# Patient Record
Sex: Female | Born: 1970 | Race: Asian | Hispanic: No | Marital: Married | Smoking: Never smoker
Health system: Southern US, Community
[De-identification: ages and names within clinical notes are randomized; demographics above are authoritative.]

## PROBLEM LIST (undated history)

## (undated) DIAGNOSIS — E119 Type 2 diabetes mellitus without complications: Secondary | ICD-10-CM

## (undated) DIAGNOSIS — K219 Gastro-esophageal reflux disease without esophagitis: Secondary | ICD-10-CM

## (undated) DIAGNOSIS — E785 Hyperlipidemia, unspecified: Secondary | ICD-10-CM

## (undated) DIAGNOSIS — I10 Essential (primary) hypertension: Secondary | ICD-10-CM

## (undated) DIAGNOSIS — D259 Leiomyoma of uterus, unspecified: Secondary | ICD-10-CM

## (undated) HISTORY — DX: Hyperlipidemia, unspecified: E78.5

## (undated) HISTORY — DX: Essential (primary) hypertension: I10

---

## 2011-10-29 ENCOUNTER — Other Ambulatory Visit (INDEPENDENT_AMBULATORY_CARE_PROVIDER_SITE_OTHER): Payer: Managed Care, Other (non HMO)

## 2011-10-29 ENCOUNTER — Encounter: Payer: Self-pay | Admitting: Internal Medicine

## 2011-10-29 ENCOUNTER — Other Ambulatory Visit (HOSPITAL_COMMUNITY)
Admission: RE | Admit: 2011-10-29 | Discharge: 2011-10-29 | Disposition: A | Discharge status: Home / Self Care | Payer: Managed Care, Other (non HMO) | Source: Ambulatory Visit | Attending: Internal Medicine | Admitting: Internal Medicine

## 2011-10-29 ENCOUNTER — Ambulatory Visit (INDEPENDENT_AMBULATORY_CARE_PROVIDER_SITE_OTHER): Payer: Managed Care, Other (non HMO) | Admitting: Internal Medicine

## 2011-10-29 VITALS — BP 132/88 | HR 94 | Temp 98.0°F | Resp 16 | Wt 174.0 lb

## 2011-10-29 DIAGNOSIS — Z01419 Encounter for gynecological examination (general) (routine) without abnormal findings: Secondary | ICD-10-CM | POA: Insufficient documentation

## 2011-10-29 DIAGNOSIS — Z0000 Encounter for general adult medical examination without abnormal findings: Secondary | ICD-10-CM

## 2011-10-29 DIAGNOSIS — H524 Presbyopia: Secondary | ICD-10-CM | POA: Insufficient documentation

## 2011-10-29 DIAGNOSIS — I10 Essential (primary) hypertension: Secondary | ICD-10-CM | POA: Insufficient documentation

## 2011-10-29 DIAGNOSIS — N946 Dysmenorrhea, unspecified: Secondary | ICD-10-CM

## 2011-10-29 DIAGNOSIS — F4320 Adjustment disorder, unspecified: Secondary | ICD-10-CM

## 2011-10-29 DIAGNOSIS — Z124 Encounter for screening for malignant neoplasm of cervix: Secondary | ICD-10-CM

## 2011-10-29 DIAGNOSIS — Z136 Encounter for screening for cardiovascular disorders: Secondary | ICD-10-CM

## 2011-10-29 DIAGNOSIS — Z1231 Encounter for screening mammogram for malignant neoplasm of breast: Secondary | ICD-10-CM | POA: Insufficient documentation

## 2011-10-29 LAB — COMPREHENSIVE METABOLIC PANEL
ALT: 24 U/L (ref 0–35)
CO2: 28 mEq/L (ref 19–32)
Calcium: 9.2 mg/dL (ref 8.4–10.5)
Chloride: 103 mEq/L (ref 96–112)
GFR: 96.49 mL/min (ref >60.00)
Sodium: 139 mEq/L (ref 135–145)
Total Protein: 7.8 g/dL (ref 6.0–8.3)

## 2011-10-29 LAB — CBC WITH DIFFERENTIAL/PLATELET
Basophils Absolute: 0 10*3/uL (ref 0.0–0.1)
Lymphocytes Relative: 22.3 % (ref 12.0–46.0)
Monocytes Relative: 5.3 % (ref 3.0–12.0)
Platelets: 319 10*3/uL (ref 150.0–400.0)
RDW: 12.7 % (ref 11.5–14.6)

## 2011-10-29 LAB — LDL CHOLESTEROL, DIRECT: Direct LDL: 163.3 mg/dL

## 2011-10-29 LAB — LIPID PANEL
Cholesterol: 235 mg/dL — ABNORMAL HIGH (ref 0–200)
Total CHOL/HDL Ratio: 5
Triglycerides: 166 mg/dL — ABNORMAL HIGH (ref 0.0–149.0)
VLDL: 33.2 mg/dL (ref 0.0–40.0)

## 2011-10-29 LAB — URINALYSIS, ROUTINE W REFLEX MICROSCOPIC
Ketones, ur: NEGATIVE
Specific Gravity, Urine: 1.01 (ref 1.000–1.030)
Urine Glucose: NEGATIVE
Urobilinogen, UA: 0.2 (ref 0.0–1.0)

## 2011-10-29 NOTE — Assessment & Plan Note (Signed)
Referral for psychotherapy.

## 2011-10-29 NOTE — Progress Notes (Signed)
Subjective:    Patient ID: Kathryn Sexton, female    DOB: 17-Aug-1971, 41 y.o.   MRN: [id number]  Hypertension This is a chronic problem. The current episode started more than 1 year ago. The problem has been gradually improving since onset. The problem is controlled. Associated symptoms include anxiety. Pertinent negatives include no blurred vision, chest pain, headaches, malaise/fatigue, neck pain, orthopnea, palpitations, peripheral edema, PND, shortness of breath or sweats. There are no associated agents to hypertension. Past treatments include nothing. The current treatment provides significant improvement. There are no compliance problems.       Review of Systems  Constitutional: Negative for fever, chills, malaise/fatigue, diaphoresis, activity change, appetite change, fatigue and unexpected weight change.  HENT: Negative.  Negative for neck pain.   Eyes: Positive for visual disturbance (difficulty with reading). Negative for blurred vision, photophobia, pain, discharge, redness and itching.  Respiratory: Negative for apnea, cough, choking, chest tightness, shortness of breath, wheezing and stridor.   Cardiovascular: Negative for chest pain, palpitations, orthopnea, leg swelling and PND.  Gastrointestinal: Negative for nausea, vomiting, abdominal pain, diarrhea, constipation, blood in stool, abdominal distention, anal bleeding and rectal pain.  Genitourinary: Positive for menstrual problem (she has monthly menstrual pain). Negative for dysuria, urgency, frequency, hematuria, flank pain, decreased urine volume, vaginal bleeding, vaginal discharge, enuresis, difficulty urinating, genital sores, vaginal pain, pelvic pain and dyspareunia.  Musculoskeletal: Negative for myalgias, back pain, joint swelling, arthralgias and gait problem.  Skin: Negative for color change, pallor, rash and wound.  Neurological: Negative for dizziness, tremors, seizures, syncope, facial asymmetry, speech difficulty,  weakness, light-headedness, numbness and headaches.  Hematological: Negative for adenopathy. Does not bruise/bleed easily.  Psychiatric/Behavioral: Positive for dysphoric mood and agitation (irritable, she and her husband want to see a psychologist). Negative for suicidal ideas, hallucinations, behavioral problems, confusion, sleep disturbance and self-injury. The patient is not nervous/anxious and is not hyperactive.        Objective:   Physical Exam  Vitals reviewed. Constitutional: She is oriented to person, place, and time. She appears well-developed and well-nourished. No distress.  HENT:  Head: Normocephalic and atraumatic.  Mouth/Throat: Oropharynx is clear and moist. No oropharyngeal exudate.  Eyes: Conjunctivae are normal. Right eye exhibits no discharge. Left eye exhibits no discharge. No scleral icterus.  Neck: Normal range of motion. Neck supple. No JVD present. No tracheal deviation present. No thyromegaly present.  Cardiovascular: Normal rate, regular rhythm, normal heart sounds and intact distal pulses.  Exam reveals no gallop and no friction rub.   No murmur heard. Pulmonary/Chest: Effort normal and breath sounds normal. No stridor. No respiratory distress. She has no wheezes. She has no rales. She exhibits no tenderness.  Abdominal: Soft. Bowel sounds are normal. She exhibits no distension and no mass. There is no tenderness. There is no rebound and no guarding. Hernia confirmed negative in the right inguinal area and confirmed negative in the left inguinal area.  Genitourinary: Rectum normal and uterus normal. Rectal exam shows no external hemorrhoid, no internal hemorrhoid, no fissure, no mass, no tenderness and anal tone normal. Guaiac negative stool. No breast swelling, tenderness, discharge or bleeding. Pelvic exam was performed with patient supine. No labial fusion. There is no rash, tenderness, lesion or injury on the right labia. There is no rash, tenderness, lesion or  injury on the left labia. Uterus is not deviated, not enlarged, not fixed and not tender. Cervix exhibits no motion tenderness, no discharge and no friability. Right adnexum displays no mass, no tenderness and  no fullness. Left adnexum displays no mass, no tenderness and no fullness. No erythema, tenderness or bleeding around the vagina. No foreign body around the vagina. No signs of injury around the vagina. No vaginal discharge found.  Musculoskeletal: Normal range of motion. She exhibits no edema and no tenderness.  Lymphadenopathy:    She has no cervical adenopathy.       Right: No inguinal adenopathy present.       Left: No inguinal adenopathy present.  Neurological: She is oriented to person, place, and time.  Skin: Skin is warm and dry. No rash noted. She is not diaphoretic. No erythema. No pallor.  Psychiatric: She has a normal mood and affect. Her behavior is normal. Judgment and thought content normal.          Assessment & Plan:

## 2011-10-29 NOTE — Assessment & Plan Note (Signed)
Her BP is well controlled with lifestyle modifications, I will check her labs today to look for secondary causes and end organ damage

## 2011-10-29 NOTE — Assessment & Plan Note (Signed)
ophth referral 

## 2011-10-29 NOTE — Patient Instructions (Signed)

## 2011-10-29 NOTE — Assessment & Plan Note (Signed)
ordered

## 2011-10-29 NOTE — Assessment & Plan Note (Signed)
She will take otc nsaids

## 2011-10-29 NOTE — Assessment & Plan Note (Signed)
Exam done, labs and mammo ordered, PAP sent, pt ed material was given

## 2011-11-02 ENCOUNTER — Encounter: Payer: Self-pay | Admitting: Internal Medicine

## 2011-11-02 ENCOUNTER — Ambulatory Visit
Admission: RE | Admit: 2011-11-02 | Discharge: 2011-11-02 | Disposition: A | Discharge status: Home / Self Care | Payer: Managed Care, Other (non HMO) | Source: Ambulatory Visit | Attending: Internal Medicine | Admitting: Internal Medicine

## 2011-11-02 DIAGNOSIS — Z1231 Encounter for screening mammogram for malignant neoplasm of breast: Secondary | ICD-10-CM

## 2011-11-30 ENCOUNTER — Ambulatory Visit (INDEPENDENT_AMBULATORY_CARE_PROVIDER_SITE_OTHER): Payer: 59 | Admitting: Psychology

## 2011-11-30 DIAGNOSIS — F411 Generalized anxiety disorder: Secondary | ICD-10-CM

## 2011-11-30 DIAGNOSIS — F331 Major depressive disorder, recurrent, moderate: Secondary | ICD-10-CM

## 2012-09-29 ENCOUNTER — Other Ambulatory Visit (HOSPITAL_COMMUNITY)
Admission: RE | Admit: 2012-09-29 | Discharge: 2012-09-29 | Disposition: A | Discharge status: Home / Self Care | Payer: BC Managed Care – PPO | Source: Ambulatory Visit | Attending: Family Medicine | Admitting: Family Medicine

## 2012-09-29 DIAGNOSIS — Z1151 Encounter for screening for human papillomavirus (HPV): Secondary | ICD-10-CM | POA: Insufficient documentation

## 2012-09-29 DIAGNOSIS — Z124 Encounter for screening for malignant neoplasm of cervix: Secondary | ICD-10-CM | POA: Insufficient documentation

## 2012-10-12 ENCOUNTER — Other Ambulatory Visit: Payer: Self-pay | Admitting: Family Medicine

## 2012-10-12 DIAGNOSIS — Z1231 Encounter for screening mammogram for malignant neoplasm of breast: Secondary | ICD-10-CM

## 2012-10-13 ENCOUNTER — Ambulatory Visit
Admission: RE | Admit: 2012-10-13 | Discharge: 2012-10-13 | Disposition: A | Discharge status: Home / Self Care | Payer: BC Managed Care – PPO | Source: Ambulatory Visit | Attending: Family Medicine | Admitting: Family Medicine

## 2012-10-13 DIAGNOSIS — Z1231 Encounter for screening mammogram for malignant neoplasm of breast: Secondary | ICD-10-CM

## 2012-11-25 ENCOUNTER — Other Ambulatory Visit: Payer: Self-pay | Admitting: Obstetrics & Gynecology

## 2012-11-25 DIAGNOSIS — D219 Benign neoplasm of connective and other soft tissue, unspecified: Secondary | ICD-10-CM

## 2012-12-01 ENCOUNTER — Other Ambulatory Visit: Payer: BC Managed Care – PPO

## 2012-12-02 ENCOUNTER — Other Ambulatory Visit: Payer: BC Managed Care – PPO

## 2012-12-09 ENCOUNTER — Ambulatory Visit
Admission: RE | Admit: 2012-12-09 | Discharge: 2012-12-09 | Disposition: A | Discharge status: Home / Self Care | Payer: BC Managed Care – PPO | Source: Ambulatory Visit | Attending: Obstetrics & Gynecology | Admitting: Obstetrics & Gynecology

## 2012-12-09 DIAGNOSIS — D219 Benign neoplasm of connective and other soft tissue, unspecified: Secondary | ICD-10-CM

## 2012-12-09 MED ORDER — GADOBENATE DIMEGLUMINE 529 MG/ML IV SOLN
15.0000 mL | Freq: Once | INTRAVENOUS | Status: AC | PRN
  Administered 2012-12-09: 15 mL via INTRAVENOUS

## 2012-12-31 ENCOUNTER — Encounter (HOSPITAL_COMMUNITY): Payer: Self-pay | Admitting: Pharmacy Technician

## 2013-01-02 ENCOUNTER — Encounter (HOSPITAL_COMMUNITY): Payer: Self-pay

## 2013-01-02 ENCOUNTER — Encounter (HOSPITAL_COMMUNITY)
Admission: RE | Admit: 2013-01-02 | Discharge: 2013-01-02 | Disposition: A | Discharge status: Home / Self Care | Payer: BC Managed Care – PPO | Source: Ambulatory Visit | Attending: Obstetrics & Gynecology | Admitting: Obstetrics & Gynecology

## 2013-01-02 LAB — CBC
HCT: 39.8 % (ref 36.0–46.0)
MCH: 27.8 pg (ref 26.0–34.0)
MCV: 83.3 fL (ref 78.0–100.0)
Platelets: 345 10*3/uL (ref 150–400)
RBC: 4.78 MIL/uL (ref 3.87–5.11)
RDW: 12.6 % (ref 11.5–15.5)
WBC: 10.1 10*3/uL (ref 4.0–10.5)

## 2013-01-02 NOTE — Pre-Procedure Instructions (Signed)
Pt's elevated BPs, history and EKG of 09/2011 and today (01/02/13) reviewed by Dr Arby Barrette. No orders given. He asked that I make patient aware that if pressures elevated on surgery day, case may be canceled. This was discussed with patient and her husband. Gave them the option of possibly seeing primary MD prior to surgery day.

## 2013-01-02 NOTE — Patient Instructions (Addendum)
20 [name]  01/02/2013   Your procedure is scheduled on:  01/09/13  Enter through the Main Entrance of [facility] at 1030 AM.  Pick up the phone at the desk and dial [phone].   Call this number if you have problems the morning of surgery: [phone]   Remember:   Do not eat food:After Midnight.  Do not drink clear liquids: 6 Hours before arrival.  Take these medicines the morning of surgery with A SIP OF WATER: NA   Do not wear jewelry, make-up or nail polish.  Do not wear lotions, powders, or perfumes. You may wear deodorant.  Do not shave 48 hours prior to surgery.  Do not bring valuables to the hospital.  Contacts, dentures or bridgework may not be worn into surgery.  Leave suitcase in the car. After surgery it may be brought to your room.  For patients admitted to the hospital, checkout time is 11:00 AM the day of discharge.   Patients discharged the day of surgery will not be allowed to drive home.  Name and phone number of your driver: NA  Special Instructions: Shower using CHG 2 nights before surgery and the night before surgery.  If you shower the day of surgery use CHG.  Use special wash - you have one bottle of CHG for all showers.  You should use approximately 1/3 of the bottle for each shower.   Please read over the following fact sheets that you were given: MRSA Information

## 2013-01-05 ENCOUNTER — Other Ambulatory Visit: Payer: Self-pay | Admitting: Obstetrics & Gynecology

## 2013-01-09 ENCOUNTER — Encounter (HOSPITAL_COMMUNITY): Payer: Self-pay | Admitting: Anesthesiology

## 2013-01-09 ENCOUNTER — Encounter (HOSPITAL_COMMUNITY)
Admission: RE | Disposition: A | Discharge status: Home / Self Care | Payer: Self-pay | Source: Ambulatory Visit | Attending: Obstetrics & Gynecology

## 2013-01-09 ENCOUNTER — Observation Stay (HOSPITAL_COMMUNITY)
Admission: RE | Admit: 2013-01-09 | Discharge: 2013-01-10 | Disposition: A | Discharge status: Home / Self Care | Payer: BC Managed Care – PPO | Source: Ambulatory Visit | Attending: Obstetrics & Gynecology | Admitting: Obstetrics & Gynecology

## 2013-01-09 ENCOUNTER — Ambulatory Visit (HOSPITAL_COMMUNITY): Payer: BC Managed Care – PPO | Admitting: Anesthesiology

## 2013-01-09 ENCOUNTER — Encounter (HOSPITAL_COMMUNITY): Payer: Self-pay | Admitting: *Deleted

## 2013-01-09 DIAGNOSIS — I10 Essential (primary) hypertension: Secondary | ICD-10-CM | POA: Insufficient documentation

## 2013-01-09 DIAGNOSIS — D252 Subserosal leiomyoma of uterus: Principal | ICD-10-CM | POA: Insufficient documentation

## 2013-01-09 DIAGNOSIS — D251 Intramural leiomyoma of uterus: Secondary | ICD-10-CM | POA: Insufficient documentation

## 2013-01-09 HISTORY — PX: ROBOT ASSISTED MYOMECTOMY: SHX5142

## 2013-01-09 LAB — TYPE AND SCREEN
ABO/RH(D): B POS
Antibody Screen: NEGATIVE

## 2013-01-09 LAB — PREGNANCY, URINE: Preg Test, Ur: NEGATIVE

## 2013-01-09 SURGERY — ROBOTIC ASSISTED MYOMECTOMY
Anesthesia: General | Site: Abdomen | Wound class: Clean Contaminated

## 2013-01-09 MED ORDER — ROCURONIUM BROMIDE 50 MG/5ML IV SOLN
INTRAVENOUS | Status: AC
  Filled 2013-01-09: qty 1

## 2013-01-09 MED ORDER — DEXAMETHASONE SODIUM PHOSPHATE 10 MG/ML IJ SOLN
INTRAMUSCULAR | Status: AC
  Filled 2013-01-09: qty 1

## 2013-01-09 MED ORDER — OXYCODONE-ACETAMINOPHEN 5-325 MG PO TABS: 1.0000 | ORAL_TABLET | ORAL | Status: DC | PRN

## 2013-01-09 MED ORDER — KETOROLAC TROMETHAMINE 30 MG/ML IJ SOLN
INTRAMUSCULAR | Status: DC | PRN
  Administered 2013-01-09: 30 mg via INTRAVENOUS

## 2013-01-09 MED ORDER — PROPOFOL 10 MG/ML IV BOLUS
INTRAVENOUS | Status: DC | PRN
  Administered 2013-01-09: 150 mg via INTRAVENOUS

## 2013-01-09 MED ORDER — LACTATED RINGERS IR SOLN
Status: DC | PRN
  Administered 2013-01-09: 3000 mL

## 2013-01-09 MED ORDER — LACTATED RINGERS IV SOLN
INTRAVENOUS | Status: DC
  Administered 2013-01-09: 1000 mL via INTRAVENOUS
  Administered 2013-01-10: 04:00:00 via INTRAVENOUS

## 2013-01-09 MED ORDER — VASOPRESSIN 20 UNIT/ML IJ SOLN
INTRAMUSCULAR | Status: AC
  Filled 2013-01-09: qty 2

## 2013-01-09 MED ORDER — BUPIVACAINE HCL (PF) 0.25 % IJ SOLN
INTRAMUSCULAR | Status: DC | PRN
  Administered 2013-01-09: 16 mL

## 2013-01-09 MED ORDER — DEXTROSE 5 % IV SOLN
2.0000 g | Freq: Once | INTRAVENOUS | Status: AC
  Administered 2013-01-09: 2 g via INTRAVENOUS
  Filled 2013-01-09: qty 2

## 2013-01-09 MED ORDER — PROPOFOL 10 MG/ML IV EMUL
INTRAVENOUS | Status: AC
  Filled 2013-01-09: qty 40

## 2013-01-09 MED ORDER — DEXAMETHASONE SODIUM PHOSPHATE 10 MG/ML IJ SOLN
INTRAMUSCULAR | Status: DC | PRN
  Administered 2013-01-09: 10 mg via INTRAVENOUS

## 2013-01-09 MED ORDER — KETOROLAC TROMETHAMINE 30 MG/ML IJ SOLN
INTRAMUSCULAR | Status: AC
  Filled 2013-01-09: qty 1

## 2013-01-09 MED ORDER — MIDAZOLAM HCL 2 MG/2ML IJ SOLN
INTRAMUSCULAR | Status: AC
  Filled 2013-01-09: qty 2

## 2013-01-09 MED ORDER — METOCLOPRAMIDE HCL 5 MG/ML IJ SOLN: 10.0000 mg | Freq: Once | INTRAMUSCULAR | Status: DC | PRN

## 2013-01-09 MED ORDER — BUPIVACAINE HCL (PF) 0.25 % IJ SOLN
INTRAMUSCULAR | Status: AC
  Filled 2013-01-09: qty 30

## 2013-01-09 MED ORDER — LACTATED RINGERS IV SOLN
INTRAVENOUS | Status: DC
  Administered 2013-01-09 (×3): via INTRAVENOUS

## 2013-01-09 MED ORDER — IBUPROFEN 600 MG PO TABS: 600.0000 mg | ORAL_TABLET | Freq: Four times a day (QID) | ORAL | Status: DC | PRN

## 2013-01-09 MED ORDER — LABETALOL HCL 5 MG/ML IV SOLN
INTRAVENOUS | Status: AC
  Filled 2013-01-09: qty 4

## 2013-01-09 MED ORDER — FENTANYL CITRATE 0.05 MG/ML IJ SOLN
INTRAMUSCULAR | Status: AC
  Filled 2013-01-09: qty 2

## 2013-01-09 MED ORDER — LABETALOL HCL 5 MG/ML IV SOLN
INTRAVENOUS | Status: DC | PRN
  Administered 2013-01-09: 5 mg via INTRAVENOUS
  Administered 2013-01-09: 10 mg via INTRAVENOUS
  Administered 2013-01-09: 5 mg via INTRAVENOUS

## 2013-01-09 MED ORDER — SCOPOLAMINE 1 MG/3DAYS TD PT72
MEDICATED_PATCH | TRANSDERMAL | Status: AC
  Administered 2013-01-09: 1.5 mg via TRANSDERMAL
  Filled 2013-01-09: qty 1

## 2013-01-09 MED ORDER — MEPERIDINE HCL 25 MG/ML IJ SOLN: 6.2500 mg | INTRAMUSCULAR | Status: DC | PRN

## 2013-01-09 MED ORDER — ONDANSETRON HCL 4 MG/2ML IJ SOLN
INTRAMUSCULAR | Status: AC
  Filled 2013-01-09: qty 2

## 2013-01-09 MED ORDER — NEOSTIGMINE METHYLSULFATE 1 MG/ML IJ SOLN
INTRAMUSCULAR | Status: AC
  Filled 2013-01-09: qty 1

## 2013-01-09 MED ORDER — MIDAZOLAM HCL 5 MG/5ML IJ SOLN
INTRAMUSCULAR | Status: DC | PRN
  Administered 2013-01-09: 2 mg via INTRAVENOUS

## 2013-01-09 MED ORDER — GLYCOPYRROLATE 0.2 MG/ML IJ SOLN
INTRAMUSCULAR | Status: AC
  Filled 2013-01-09: qty 2

## 2013-01-09 MED ORDER — SUFENTANIL CITRATE 50 MCG/ML IV SOLN
INTRAVENOUS | Status: AC
  Filled 2013-01-09: qty 1

## 2013-01-09 MED ORDER — ARTIFICIAL TEARS OP OINT
TOPICAL_OINTMENT | OPHTHALMIC | Status: DC | PRN
  Administered 2013-01-09: 1 via OPHTHALMIC

## 2013-01-09 MED ORDER — NEOSTIGMINE METHYLSULFATE 1 MG/ML IJ SOLN
INTRAMUSCULAR | Status: DC | PRN
  Administered 2013-01-09: 3 mg via INTRAVENOUS

## 2013-01-09 MED ORDER — ROCURONIUM BROMIDE 100 MG/10ML IV SOLN
INTRAVENOUS | Status: DC | PRN
  Administered 2013-01-09: 50 mg via INTRAVENOUS
  Administered 2013-01-09: 5 mg via INTRAVENOUS
  Administered 2013-01-09 (×3): 10 mg via INTRAVENOUS

## 2013-01-09 MED ORDER — VASOPRESSIN 20 UNIT/ML IJ SOLN
INTRAMUSCULAR | Status: DC | PRN
  Administered 2013-01-09: 40 [IU]

## 2013-01-09 MED ORDER — ONDANSETRON HCL 4 MG/2ML IJ SOLN
INTRAMUSCULAR | Status: DC | PRN
  Administered 2013-01-09: 4 mg via INTRAVENOUS

## 2013-01-09 MED ORDER — GLYCOPYRROLATE 0.2 MG/ML IJ SOLN
INTRAMUSCULAR | Status: AC
  Filled 2013-01-09: qty 1

## 2013-01-09 MED ORDER — LIDOCAINE HCL (CARDIAC) 20 MG/ML IV SOLN
INTRAVENOUS | Status: AC
  Filled 2013-01-09: qty 5

## 2013-01-09 MED ORDER — SUFENTANIL CITRATE 50 MCG/ML IV SOLN
INTRAVENOUS | Status: DC | PRN
  Administered 2013-01-09: 15 ug via INTRAVENOUS
  Administered 2013-01-09: 10 ug via INTRAVENOUS
  Administered 2013-01-09: 5 ug via INTRAVENOUS
  Administered 2013-01-09: 10 ug via INTRAVENOUS

## 2013-01-09 MED ORDER — HYDROMORPHONE HCL PF 1 MG/ML IJ SOLN
1.0000 mg | INTRAMUSCULAR | Status: DC | PRN
  Administered 2013-01-09: 1 mg via INTRAVENOUS
  Filled 2013-01-09: qty 1

## 2013-01-09 MED ORDER — LIDOCAINE HCL (CARDIAC) 20 MG/ML IV SOLN
INTRAVENOUS | Status: DC | PRN
  Administered 2013-01-09: 40 mg via INTRAVENOUS

## 2013-01-09 MED ORDER — SCOPOLAMINE 1 MG/3DAYS TD PT72: 1.0000 | MEDICATED_PATCH | TRANSDERMAL | Status: DC

## 2013-01-09 MED ORDER — FENTANYL CITRATE 0.05 MG/ML IJ SOLN
25.0000 ug | INTRAMUSCULAR | Status: DC | PRN
  Administered 2013-01-09 (×2): 50 ug via INTRAVENOUS

## 2013-01-09 MED ORDER — GLYCOPYRROLATE 0.2 MG/ML IJ SOLN
INTRAMUSCULAR | Status: DC | PRN
  Administered 2013-01-09: .6 mg via INTRAVENOUS

## 2013-01-09 SURGICAL SUPPLY — 57 items
BARRIER ADHS 3X4 INTERCEED (GAUZE/BANDAGES/DRESSINGS) ×4 IMPLANT
BLADE LAP MORCELLATOR 15X9.5 (ELECTROSURGICAL) ×2 IMPLANT
CHLORAPREP W/TINT 26ML (MISCELLANEOUS) ×2 IMPLANT
CLOTH BEACON ORANGE TIMEOUT ST (SAFETY) ×2 IMPLANT
DECANTER SPIKE VIAL GLASS SM (MISCELLANEOUS) ×2 IMPLANT
DERMABOND ADVANCED (GAUZE/BANDAGES/DRESSINGS) ×1
DERMABOND ADVANCED .7 DNX12 (GAUZE/BANDAGES/DRESSINGS) ×1 IMPLANT
DRAPE HUG U DISPOSABLE (DRAPE) ×2 IMPLANT
DRAPE LG THREE QUARTER DISP (DRAPES) ×4 IMPLANT
DRAPE WARM FLUID 44X44 (DRAPE) ×2 IMPLANT
ELECT REM PT RETURN 9FT ADLT (ELECTROSURGICAL) ×2
ELECTRODE REM PT RTRN 9FT ADLT (ELECTROSURGICAL) ×1 IMPLANT
EVACUATOR SMOKE 8.L (FILTER) ×2 IMPLANT
GAUZE VASELINE 3X9 (GAUZE/BANDAGES/DRESSINGS) ×2 IMPLANT
GLOVE BIO SURGEON STRL SZ 6.5 (GLOVE) ×2 IMPLANT
GLOVE BIOGEL PI IND STRL 7.0 (GLOVE) ×2 IMPLANT
GLOVE BIOGEL PI INDICATOR 7.0 (GLOVE) ×2
GLOVE ECLIPSE 6.5 STRL STRAW (GLOVE) ×6 IMPLANT
GOWN STRL REIN XL XLG (GOWN DISPOSABLE) ×12 IMPLANT
IV SET ADMIN PUMP GEMINI W/NLD (IV SETS) ×2 IMPLANT
IV STOPCOCK 4 WAY 40  W/Y SET (IV SOLUTION) ×1
IV STOPCOCK 4 WAY 40 W/Y SET (IV SOLUTION) ×1 IMPLANT
KIT ACCESSORY DA VINCI DISP (KITS) ×1
KIT ACCESSORY DVNC DISP (KITS) ×1 IMPLANT
LEGGING LITHOTOMY PAIR STRL (DRAPES) ×2 IMPLANT
NEEDLE HYPO 22GX1.5 SAFETY (NEEDLE) ×2 IMPLANT
OCCLUDER COLPOPNEUMO (BALLOONS) IMPLANT
PACK LAVH (CUSTOM PROCEDURE TRAY) ×2 IMPLANT
SET CYSTO W/LG BORE CLAMP LF (SET/KITS/TRAYS/PACK) IMPLANT
SET IRRIG TUBING LAPAROSCOPIC (IRRIGATION / IRRIGATOR) ×4 IMPLANT
SOLUTION ELECTROLUBE (MISCELLANEOUS) ×2 IMPLANT
SUT VIC AB 0 CT1 27 (SUTURE) ×2
SUT VIC AB 0 CT1 27XBRD ANTBC (SUTURE) ×2 IMPLANT
SUT VIC AB 2-0 CT1 27 (SUTURE) ×1
SUT VIC AB 2-0 CT1 TAPERPNT 27 (SUTURE) ×1 IMPLANT
SUT VIC AB 4-0 PS2 27 (SUTURE) IMPLANT
SUT VICRYL 0 UR6 27IN ABS (SUTURE) ×4 IMPLANT
SUT VLOC 180 0 9IN  GS21 (SUTURE) ×2
SUT VLOC 180 0 9IN GS21 (SUTURE) ×2 IMPLANT
SUT VLOC 180 2-0 9IN GS21 (SUTURE) ×2 IMPLANT
SYR 50ML LL SCALE MARK (SYRINGE) ×2 IMPLANT
SYSTEM CONVERTIBLE TROCAR (TROCAR) ×4 IMPLANT
TIP RUMI ORANGE 6.7MMX12CM (TIP) ×2 IMPLANT
TIP UTERINE 5.1X6CM LAV DISP (MISCELLANEOUS) IMPLANT
TIP UTERINE 6.7X10CM GRN DISP (MISCELLANEOUS) IMPLANT
TIP UTERINE 6.7X6CM WHT DISP (MISCELLANEOUS) IMPLANT
TIP UTERINE 6.7X8CM BLUE DISP (MISCELLANEOUS) IMPLANT
TOWEL OR 17X24 6PK STRL BLUE (TOWEL DISPOSABLE) ×4 IMPLANT
TRAY FOLEY BAG SILVER LF 14FR (CATHETERS) ×2 IMPLANT
TROCAR 12M 150ML BLUNT (TROCAR) IMPLANT
TROCAR DISP BLADELESS 8 DVNC (TROCAR) ×1 IMPLANT
TROCAR DISP BLADELESS 8MM (TROCAR) ×1
TROCAR OPTI TIP 12M 100M (ENDOMECHANICALS) IMPLANT
TROCAR XCEL 12X100 BLDLESS (ENDOMECHANICALS) ×2 IMPLANT
TROCAR XCEL NON-BLD 5MMX100MML (ENDOMECHANICALS) ×2 IMPLANT
TUBING FILTER THERMOFLATOR (ELECTROSURGICAL) ×2 IMPLANT
WATER STERILE IRR 1000ML POUR (IV SOLUTION) ×6 IMPLANT

## 2013-01-09 NOTE — H&P (Signed)
Kathryn Sexton is an 42 y.o. female  G1P1  RP:  Sxic uterine myomas for myomectomies assisted with Engineer, building services  Pertinent Gynecological History: Menses: flow is moderate Contraception: none Blood transfusions: none Sexually transmitted diseases: no past history Previous GYN Procedures: C/S  Last mammogram: normal Last pap: normal OB History:  G1P1 C/S  Menstrual History:  No LMP recorded.    Past Medical History  Diagnosis Date  . Hypertension     states BP changes, no medication    Past Surgical History  Procedure Laterality Date  . Cesarean section      Family History  Problem Relation Age of Onset  . Hypertension Father   . Stroke Neg Hx   . Cancer Neg Hx   . Diabetes Neg Hx   . Heart disease Neg Hx   . Hyperlipidemia Neg Hx     Social History:  reports that she has never smoked. She does not have any smokeless tobacco history on file. She reports that she does not drink alcohol or use illicit drugs.  Allergies: No Known Allergies  No prescriptions prior to admission     Blood pressure 154/106, pulse 95, temperature 98.1 F (36.7 C), temperature source Oral, SpO2 99.00%.  Pelvic US and MRI:  Largest 4 myomas IM 3 to 7 cm.  Benign appearance.   Results for orders placed during the hospital encounter of 01/09/13 (from the past 24 hour(s))  TYPE AND SCREEN     Status: None   Collection Time    01/09/13 11:29 AM      Result Value Range   ABO/RH(D) B POS     Antibody Screen NEG     Sample Expiration 01/12/2013      No results found.  Assessment/Plan:  Sxic uterine myomas for myomectomies assisted with Engineer, building services.  Surgery and risks reviewed.  Kathryn Sexton,MARIE-LYNE 01/09/2013, 12:47 PM

## 2013-01-09 NOTE — Anesthesia Postprocedure Evaluation (Signed)
  Anesthesia Post-op Note  Patient: Kathryn Sexton  Procedure(s) Performed: Procedure(s): ROBOTIC ASSISTED MYOMECTOMY (N/A)  Patient Location: PACU  Anesthesia Type:General  Level of Consciousness: awake, oriented and sedated  Airway and Oxygen Therapy: Patient Spontanous Breathing  Post-op Pain: none  Post-op Assessment: Post-op Vital signs reviewed, Patient's Cardiovascular Status Stable, Respiratory Function Stable, Patent Airway, No signs of Nausea or vomiting and Pain level controlled  Post-op Vital Signs: Reviewed and stable  Complications: No apparent anesthesia complications

## 2013-01-09 NOTE — Transfer of Care (Signed)
Immediate Anesthesia Transfer of Care Note  Patient: Kathryn Sexton  Procedure(s) Performed: Procedure(s): ROBOTIC ASSISTED MYOMECTOMY (N/A)  Patient Location: PACU  Anesthesia Type:General  Level of Consciousness: awake, alert , oriented and patient cooperative  Airway & Oxygen Therapy: Patient Spontanous Breathing and Patient connected to nasal cannula oxygen  Post-op Assessment: Report given to PACU RN and Post -op Vital signs reviewed and stable  Post vital signs: Reviewed and stable  Complications: No apparent anesthesia complications

## 2013-01-09 NOTE — Op Note (Signed)
01/09/2013  5:24 PM  PATIENT:  Kathryn Sexton  42 y.o. female  PRE-OPERATIVE DIAGNOSIS:  Large Symptomatic Fibroids    POST-OPERATIVE DIAGNOSIS:  large symptomatic fibroids  PROCEDURE:  Procedure(s): ROBOTIC ASSISTED MYOMECTOMIES  SURGEON:  Surgeon(s): Genia Del, MD Lenoard Aden, MD  ASSISTANTS: Dr Lenoard Aden   ANESTHESIA:   general  PROCEDURE:  Under general anesthesia with endotracheal intubation the patient is in lithotomy position. She is prepped with ChloraPrep on the abdomen and with Betadine on the suprapubic, vulvar and vaginal areas. She is draped as usual.  Vaginally we insert the weighted speculum.  The anterior lip of the cervix is grasped with a tenaculum. Dilation of the cervix with Hegar dilators. The hysterometry is at 16 cm. A #12 roomy is used with a small Coe ring and those are put in place easily.  The tenaculum and weighted speculum were removed. The Foley is inserted in the bladder. We go to the abdomen. A supraumbilical incision is done with the scalpel over 1.5 cm after infiltration of Marcaine one quarter plain. The aponeurosis is opened under direct vision with Mayo scissors. The parietal peritoneum is opened under direct vision with Metzenbaums scissors. A pursestring stitch is done on the aponeurosis with Vicryl 0.  The Roseanne Reno is inserted and a pneumoperitoneum is created with CO2.  The camera is inserted. The abdominal wall is free of adhesions. We used the semicircular configuration for port placement with 2 robotic ports on the right one robotic ports on the lower left and the assistant port a 5 mm trocar on the upper left. All ports are inserted under direct vision after infiltration of the skin with Marcaine one quarter plain. The robot his docked from the right. The instruments are put in place with the Endo Shears scissors on the right arm, the PK in the left arm and the tenaculum and the third arm. We go to the console.  Inspection of the  abdominopelvic cavities is done. The liver is normal in appearance. The appendix is not seen. And the pelvis both ovaries and both tubes are normal in size and appearance.  The uterus presents multiple uterine myomas, the largest ones are posteriorly positioned but other fibroids are also present on the anterior aspect of the uterus.  anteriorly adhesions are present between the uterus and the lower anterior wall of the pelvis, the omentum is also adherent to this area.  Those adhesions are lysed.  We then infiltrated vasopressin on the posterior aspect of the uterus.  A vertical incision is on done where Pitressin was infiltrated.  We used the tip of the Endo Shears scissor to make that incision.  4 large intramural fibroids were removed through that incision and 3 smaller ones.  2 very small fibroids were removed from the left aspect of that incision but those were subserosal.  We then infiltrated Pitressin on the anterior aspect of the uterus in a horizontal line.  An incision was made along that line with the tip of the Endo Shears scissors. The 5 intramural and subserosal myomas were removed from that incision.  We then switched instruments. We used the cutting needle driver in the right arm, the long tip and the left arm and the PK in the third.  We used the lock 0 on to close on the myometrium of both we used a running suture. We then used a V. Lock 2-0 to close the serosa with a baseball stitch at the posterior incision. We used  a Vicryl 2-0 to close the serosa on the anterior incision with a baseball stitch as well.  Hemostasis was adequate at all levels.  We therefore removed all robotic instruments. The robot was undocked. And we went to laparoscopy times. The 4 needles were removed. We then used a morcellator through the supraumbilical incision to complete morcellation of all the myoma. The 3 smaller myomas had been removed already. We removed the11 remaining myomas as counted.  We then irrigated and  suctioned the abdominopelvic cavities. We applied Interceed on both incisions. We removed all laparoscopy instruments.  We removed all trochars under direct vision. We evacuated the CO2.  We closed the supraumbilical incision by attaching the pursestring stitch. We closed the skin of all incisions with a subcuticular stitch of Vicryl 4-0. We added Dermabond on all incisions.  The instruments were removed from the vagina. The patient was brought to recovery room in good and stable status.  Note that pictures were taken prior to the myomectomies and after.  ESTIMATED BLOOD LOSS: 150 cc   Intake/Output Summary (Last 24 hours) at 01/09/13 1724 Last data filed at 01/09/13 1715  Gross per 24 hour  Intake   1000 ml  Output   1000 ml  Net      0 ml     BLOOD ADMINISTERED:none   LOCAL MEDICATIONS USED:  MARCAINE     SPECIMEN:  Source of Specimen:  Uterine myomas, about 240 g, 14 myomas  DISPOSITION OF SPECIMEN:  PATHOLOGY  COUNTS:  YES  PLAN OF CARE: Transfer to PACU   Genia Del MD  01/09/13 at 5:26 pm

## 2013-01-09 NOTE — Anesthesia Preprocedure Evaluation (Addendum)
Anesthesia Evaluation  Patient identified by MRN, date of birth, ID band Patient awake    Reviewed: Allergy & Precautions, H&P , NPO status , Patient's Chart, lab work & pertinent test results  Airway Mallampati: III TM Distance: >3 FB Neck ROM: Full    Dental no notable dental hx. (+) Teeth Intact   Pulmonary neg pulmonary ROS,  breath sounds clear to auscultation  Pulmonary exam normal       Cardiovascular hypertension, negative cardio ROS  Rhythm:Regular Rate:Normal     Neuro/Psych PSYCHIATRIC DISORDERS negative neurological ROS     GI/Hepatic negative GI ROS, Neg liver ROS,   Endo/Other  negative endocrine ROS  Renal/GU negative Renal ROS  negative genitourinary   Musculoskeletal negative musculoskeletal ROS (+)   Abdominal   Peds  Hematology negative hematology ROS (+)   Anesthesia Other Findings   Reproductive/Obstetrics Uterine Fibroids                         Anesthesia Physical Anesthesia Plan  ASA: II  Anesthesia Plan: General   Post-op Pain Management:    Induction: Intravenous  Airway Management Planned: Oral ETT  Additional Equipment:   Intra-op Plan:   Post-operative Plan: Extubation in OR  Informed Consent: I have reviewed the patients History and Physical, chart, labs and discussed the procedure including the risks, benefits and alternatives for the proposed anesthesia with the patient or authorized representative who has indicated his/her understanding and acceptance.   Dental advisory given  Plan Discussed with: CRNA, Anesthesiologist and Surgeon  Anesthesia Plan Comments:         Anesthesia Quick Evaluation

## 2013-01-10 LAB — CBC
Hemoglobin: 11.7 g/dL — ABNORMAL LOW (ref 12.0–15.0)
MCH: 27.9 pg (ref 26.0–34.0)
MCV: 83.3 fL (ref 78.0–100.0)
RBC: 4.2 MIL/uL (ref 3.87–5.11)

## 2013-01-10 MED ORDER — OXYCODONE-ACETAMINOPHEN 7.5-325 MG PO TABS: 1.0000 | ORAL_TABLET | ORAL | Status: DC | PRN

## 2013-01-10 NOTE — Discharge Summary (Signed)
  Physician Discharge Summary  Patient ID: Kathryn Sexton MRN: [id number] DOB/AGE: December 01, 1970 42 y.o.  Admit date: 01/09/2013 Discharge date: 01/10/2013  Admission Diagnoses: Large Symptomatic Fibroids  40981  Discharge Diagnoses: Large Symptomatic Fibroids  19147        Active Problems:   * No active hospital problems. *   Discharged Condition: good  Hospital Course: good  Consults: None  Treatments: surgery: Robotic myomectomies, lysis of adhesions  Disposition: Final discharge disposition not confirmed     Medication List    TAKE these medications       oxyCODONE-acetaminophen 7.5-325 MG per tablet  Commonly known as:  PERCOCET  Take 1 tablet by mouth every 4 (four) hours as needed for pain.           Follow-up Information   Follow up with Karolyn Messing,MARIE-LYNE, MD In 3 weeks.   Contact information:   [address] [place] [address] [phone]       Signed: Genia Del, MD 01/10/2013, 1:26 PM

## 2013-01-10 NOTE — Anesthesia Postprocedure Evaluation (Signed)
  Anesthesia Post-op Note  Patient: Kathryn Sexton  Procedure(s) Performed: Procedure(s): ROBOTIC ASSISTED MYOMECTOMY (N/A)  Patient Location: [facility] unit  Anesthesia Type:General  Level of Consciousness: awake, alert  and oriented  Airway and Oxygen Therapy: Patient Spontanous Breathing and Patient connected to nasal cannula oxygen  Post-op Pain: none  Post-op Assessment: Post-op Vital signs reviewed and Patient's Cardiovascular Status Stable  Post-op Vital Signs: Reviewed and stable  Complications: No apparent anesthesia complications

## 2013-01-10 NOTE — Progress Notes (Signed)
1 Day Post-Op Procedure(s) (LRB): ROBOTIC ASSISTED MYOMECTOMY (N/A)  Subjective: Patient reports that pain is well managed.  Tolerating normal diet as tolerated  diet without difficulty. No nausea / vomiting.  Ambulating and voiding.  Objective: BP 122/85  Pulse 86  Temp(Src) 98.5 F (36.9 C) (Oral)  Resp 18  Ht 5' 4.5" (1.638 m)  Wt 72.576 kg (160 lb)  BMI 27.05 kg/m2  SpO2 96% Lungs: clear Heart: normal rate and rhythm Abdomen:soft and appropriately tender Extremities: Homans sign is negative, no sign of DVT Incision:  Healing well, no drainage  Hb post op 11.7  Assessment: s/p Procedure(s): ROBOTIC ASSISTED MYOMECTOMY: progressing well  Plan: Discharge home  LOS: 1 day    Kathryn Sexton,Kathryn Sexton 01/10/2013, 1:23 PM

## 2013-01-11 ENCOUNTER — Encounter (HOSPITAL_COMMUNITY): Payer: Self-pay | Admitting: Obstetrics & Gynecology

## 2014-03-02 ENCOUNTER — Other Ambulatory Visit: Payer: Self-pay

## 2014-03-02 DIAGNOSIS — Z1231 Encounter for screening mammogram for malignant neoplasm of breast: Secondary | ICD-10-CM

## 2014-03-05 ENCOUNTER — Ambulatory Visit: Payer: BC Managed Care – PPO

## 2014-03-05 ENCOUNTER — Other Ambulatory Visit: Payer: Self-pay | Admitting: Obstetrics & Gynecology

## 2014-03-05 DIAGNOSIS — N644 Mastodynia: Secondary | ICD-10-CM

## 2014-03-05 DIAGNOSIS — N63 Unspecified lump in unspecified breast: Secondary | ICD-10-CM

## 2014-03-09 ENCOUNTER — Other Ambulatory Visit: Payer: Self-pay | Admitting: Obstetrics & Gynecology

## 2014-03-09 ENCOUNTER — Ambulatory Visit
Admission: RE | Admit: 2014-03-09 | Discharge: 2014-03-09 | Disposition: A | Discharge status: Home / Self Care | Payer: BC Managed Care – PPO | Source: Ambulatory Visit | Attending: Obstetrics & Gynecology | Admitting: Obstetrics & Gynecology

## 2014-03-09 DIAGNOSIS — N63 Unspecified lump in unspecified breast: Secondary | ICD-10-CM

## 2014-03-09 DIAGNOSIS — N644 Mastodynia: Secondary | ICD-10-CM

## 2016-03-16 ENCOUNTER — Other Ambulatory Visit: Payer: Self-pay

## 2016-03-16 ENCOUNTER — Emergency Department (HOSPITAL_COMMUNITY)
Admission: EM | Admit: 2016-03-16 | Discharge: 2016-03-17 | Disposition: A | Discharge status: Home / Self Care | Payer: BLUE CROSS/BLUE SHIELD | Attending: Emergency Medicine | Admitting: Emergency Medicine

## 2016-03-16 ENCOUNTER — Encounter (HOSPITAL_COMMUNITY): Payer: Self-pay | Admitting: *Deleted

## 2016-03-16 ENCOUNTER — Emergency Department (HOSPITAL_COMMUNITY): Payer: BLUE CROSS/BLUE SHIELD

## 2016-03-16 DIAGNOSIS — Z79899 Other long term (current) drug therapy: Secondary | ICD-10-CM | POA: Insufficient documentation

## 2016-03-16 DIAGNOSIS — I159 Secondary hypertension, unspecified: Secondary | ICD-10-CM

## 2016-03-16 DIAGNOSIS — R0789 Other chest pain: Secondary | ICD-10-CM | POA: Insufficient documentation

## 2016-03-16 DIAGNOSIS — R079 Chest pain, unspecified: Secondary | ICD-10-CM | POA: Diagnosis present

## 2016-03-16 HISTORY — DX: Leiomyoma of uterus, unspecified: D25.9

## 2016-03-16 LAB — CBC
HEMATOCRIT: 40.4 % (ref 36.0–46.0)
Hemoglobin: 12.7 g/dL (ref 12.0–15.0)
MCH: 26.8 pg (ref 26.0–34.0)
MCHC: 31.4 g/dL (ref 30.0–36.0)
MCV: 85.4 fL (ref 78.0–100.0)
Platelets: 319 10*3/uL (ref 150–400)
RBC: 4.73 MIL/uL (ref 3.87–5.11)
RDW: 12.9 % (ref 11.5–15.5)
WBC: 9.2 10*3/uL (ref 4.0–10.5)

## 2016-03-16 LAB — I-STAT TROPONIN, ED: Troponin i, poc: 0 ng/mL (ref 0.00–0.08)

## 2016-03-16 LAB — BASIC METABOLIC PANEL
ANION GAP: 7 (ref 5–15)
BUN: 9 mg/dL (ref 6–20)
CALCIUM: 8.9 mg/dL (ref 8.9–10.3)
CO2: 23 mmol/L (ref 22–32)
Chloride: 107 mmol/L (ref 101–111)
Creatinine, Ser: 0.68 mg/dL (ref 0.44–1.00)
GFR calc Af Amer: >60 mL/min (ref >60)
GFR calc non Af Amer: >60 mL/min (ref >60)
GLUCOSE: 107 mg/dL — AB (ref 65–99)
POTASSIUM: 3.9 mmol/L (ref 3.5–5.1)
Sodium: 137 mmol/L (ref 135–145)

## 2016-03-16 NOTE — ED Notes (Signed)
Pt states she had chest pain a few days ago which went away with some sleep and rest. States she went to her PCP today with HTN and chest pain. BP at PCP was 187/113. Pt reports chest pain is central and radiates to her back. PCP sent her to ED.

## 2016-03-17 ENCOUNTER — Other Ambulatory Visit: Payer: Self-pay

## 2016-03-17 MED ORDER — AMLODIPINE BESYLATE 10 MG PO TABS: 10.0000 mg | ORAL_TABLET | Freq: Every day | ORAL | Status: DC

## 2016-03-17 NOTE — Discharge Instructions (Signed)
Hypertension Hypertension, commonly called high blood pressure, is when the force of blood pumping through your arteries is too strong. Your arteries are the blood vessels that carry blood from your heart throughout your body. A blood pressure reading consists of a higher number over a lower number, such as 110/72. The higher number (systolic) is the pressure inside your arteries when your heart pumps. The lower number (diastolic) is the pressure inside your arteries when your heart relaxes. Ideally you want your blood pressure below 120/80. Hypertension forces your heart to work harder to pump blood. Your arteries may become narrow or stiff. Having untreated or uncontrolled hypertension can cause heart attack, stroke, kidney disease, and other problems. RISK FACTORS Some risk factors for high blood pressure are controllable. Others are not.  Risk factors you cannot control include:   Race. You may be at higher risk if you are African American.  Age. Risk increases with age.  Gender. Men are at higher risk than women before age 45 years. After age 65, women are at higher risk than men. Risk factors you can control include:  Not getting enough exercise or physical activity.  Being overweight.  Getting too much fat, sugar, calories, or salt in your diet.  Drinking too much alcohol. SIGNS AND SYMPTOMS Hypertension does not usually cause signs or symptoms. Extremely high blood pressure (hypertensive crisis) may cause headache, anxiety, shortness of breath, and nosebleed. DIAGNOSIS To check if you have hypertension, your health care provider will measure your blood pressure while you are seated, with your arm held at the level of your heart. It should be measured at least twice using the same arm. Certain conditions can cause a difference in blood pressure between your right and left arms. A blood pressure reading that is higher than normal on one occasion does not mean that you need treatment. If  it is not clear whether you have high blood pressure, you may be asked to return on a different day to have your blood pressure checked again. Or, you may be asked to monitor your blood pressure at home for 1 or more weeks. TREATMENT Treating high blood pressure includes making lifestyle changes and possibly taking medicine. Living a healthy lifestyle can help lower high blood pressure. You may need to change some of your habits. Lifestyle changes may include:  Following the DASH diet. This diet is high in fruits, vegetables, and whole grains. It is low in salt, red meat, and added sugars.  Keep your sodium intake below 2,300 mg per day.  Getting at least 30-45 minutes of aerobic exercise at least 4 times per week.  Losing weight if necessary.  Not smoking.  Limiting alcoholic beverages.  Learning ways to reduce stress. Your health care provider may prescribe medicine if lifestyle changes are not enough to get your blood pressure under control, and if one of the following is true:  You are 18-59 years of age and your systolic blood pressure is above 140.  You are 60 years of age or older, and your systolic blood pressure is above 150.  Your diastolic blood pressure is above 90.  You have diabetes, and your systolic blood pressure is over 140 or your diastolic blood pressure is over 90.  You have kidney disease and your blood pressure is above 140/90.  You have heart disease and your blood pressure is above 140/90. Your personal target blood pressure may vary depending on your medical conditions, your age, and other factors. HOME CARE INSTRUCTIONS    Have your blood pressure rechecked as directed by your health care provider.   °· Take medicines only as directed by your health care provider. Follow the directions carefully. Blood pressure medicines must be taken as prescribed. The medicine does not work as well when you skip doses. Skipping doses also puts you at risk for  problems. °· Do not smoke.   °· Monitor your blood pressure at home as directed by your health care provider.  °SEEK MEDICAL CARE IF:  °· You think you are having a reaction to medicines taken. °· You have recurrent headaches or feel dizzy. °· You have swelling in your ankles. °· You have trouble with your vision. °SEEK IMMEDIATE MEDICAL CARE IF: °· You develop a severe headache or confusion. °· You have unusual weakness, numbness, or feel faint. °· You have severe chest or abdominal pain. °· You vomit repeatedly. °· You have trouble breathing. °MAKE SURE YOU:  °· Understand these instructions. °· Will watch your condition. °· Will get help right away if you are not doing well or get worse. °  °This information is not intended to replace advice given to you by your health care provider. Make sure you discuss any questions you have with your health care provider. °  °Document Released: 09/14/2005 Document Revised: 01/29/2015 Document Reviewed: 07/07/2013 °Elsevier Interactive Patient Education ©2016 [employer] ° °How to Take Your Blood Pressure °HOW DO I GET A BLOOD PRESSURE MACHINE? °· You can buy an electronic home blood pressure machine at your local pharmacy. Insurance will sometimes cover the cost if you have a prescription. °· Ask your doctor what type of machine is best for you. There are different machines for your arm and your wrist. °· If you decide to buy a machine to check your blood pressure on your arm, first check the size of your arm so you can buy the right size cuff. To check the size of your arm:   °¨ Use a measuring tape that shows both inches and centimeters.   °¨ Wrap the measuring tape around the upper-middle part of your arm. You may need someone to help you measure.   °¨ Write down your arm measurement in both inches and centimeters.   °· To measure your blood pressure correctly, it is important to have the right size cuff.   °¨ If your arm is up to 13 inches (up to 34 centimeters), get an  adult cuff size. °¨ If your arm is 13 to 17 inches (35 to 44 centimeters), get a large adult cuff size.   °¨  If your arm is 17 to 20 inches (45 to 52 centimeters), get an adult thigh cuff.   °WHAT DO THE NUMBERS MEAN?  °· There are two numbers that make up your blood pressure. For example: 120/80. °¨ The first number (120 in our example) is called the "systolic pressure." It is a measure of the pressure in your blood vessels when your heart is pumping blood. °¨ The second number (80 in our example) is called the "diastolic pressure." It is a measure of the pressure in your blood vessels when your heart is resting between beats. °· Your doctor will tell you what your blood pressure should be. °WHAT SHOULD I DO BEFORE I CHECK MY BLOOD PRESSURE?  °· Try to rest or relax for at least 30 minutes before you check your blood pressure. °· Do not smoke. °· Do not have any drinks with caffeine, such as: °¨ Soda. °¨ Coffee. °¨ Tea. °· Check your blood pressure in a   quiet room. °· Sit down and stretch out your arm on a table. Keep your arm at about the level of your heart. Let your arm relax. °· Make sure that your legs are not crossed. °HOW DO I CHECK MY BLOOD PRESSURE? °· Follow the directions that came with your machine. °· Make sure you remove any tight-fitting clothing from your arm or wrist. Wrap the cuff around your upper arm or wrist. You should be able to fit a finger between the cuff and your arm. If you cannot fit a finger between the cuff and your arm, it is too tight and should be removed and rewrapped. °· Some units require you to manually pump up the arm cuff. °· Automatic units inflate the cuff when you press a button. °· Cuff deflation is automatic in both models. °· After the cuff is inflated, the unit measures your blood pressure and pulse. The readings are shown on a monitor. Hold still and breathe normally while the cuff is inflated. °· Getting a reading takes less than a minute. °· Some models store  readings in a memory. Some provide a printout of readings. If your machine does not store your readings, keep a written record. °· Take readings with you to your next visit with your doctor. °  °This information is not intended to replace advice given to you by your health care provider. Make sure you discuss any questions you have with your health care provider. °  °Document Released: 08/27/2008 Document Revised: 10/05/2014 Document Reviewed: 11/09/2013 °Elsevier Interactive Patient Education ©2016 Elsevier Inc. ° °

## 2016-03-17 NOTE — ED Notes (Signed)
Pt departed in NAD, refused use of a wheelchair.  

## 2016-03-17 NOTE — ED Provider Notes (Addendum)
CSN: [id number]     Arrival date & time 03/16/16  1831 History   First MD Initiated Contact with Patient 03/16/16 2214     Chief Complaint  Patient presents with  . Hypertension  . Chest Pain     (Consider location/radiation/quality/duration/timing/severity/associated sxs/prior Treatment) HPI   Kathryn Sexton is a 45 y.o. female who presents here for evaluation of high blood pressure. She went to an acute care clinic today because her blood pressure been elevated at home. He has been taking it and found it to be in the range of 180/113. She has a history of gestational hypertension, 17 years ago. She has mild dyspnea on exertion, and periods of chest pain lasting about a minute, and go spontaneously. She has had chronic lower extremity edema for many years. She is not currently taking any antihypertensives. After brief evaluation at the clinic, she was sent to the emergency department for her symptoms and high blood pressure. Since being in the emergency department, she has no additional complaints. There are no other known modifying factors.   Past Medical History  Diagnosis Date  . Hypertension     states BP changes, no medication  . Uterine fibroid    Past Surgical History  Procedure Laterality Date  . Cesarean section    . Robot assisted myomectomy N/A 01/09/2013    Procedure: ROBOTIC ASSISTED MYOMECTOMY;  Surgeon: [name], MD;  Location: [facility];  Service: Gynecology;  Laterality: N/A;   Family History  Problem Relation Age of Onset  . Hypertension Father   . Stroke Neg Hx   . Cancer Neg Hx   . Diabetes Neg Hx   . Heart disease Neg Hx   . Hyperlipidemia Neg Hx    Social History  Substance Use Topics  . Smoking status: Never Smoker   . Smokeless tobacco: None  . Alcohol Use: No   OB History    No data available     Review of Systems  All other systems reviewed and are negative.     Allergies  Review of patient's allergies indicates no known  allergies.  Home Medications   Prior to Admission medications   Medication Sig Start Date End Date Taking? Authorizing Provider  amLODipine (NORVASC) 10 MG tablet Take 1 tablet (10 mg total) by mouth daily. 03/17/16   Daleen Bo, MD  oxyCODONE-acetaminophen (PERCOCET) 7.5-325 MG per tablet Take 1 tablet by mouth every 4 (four) hours as needed for pain. 01/10/13   [name], MD   BP 176/109 mmHg  Pulse 73  Temp(Src) 99.1 F (37.3 C) (Oral)  Resp 17  SpO2 100%  LMP 02/19/2016 Physical Exam  Constitutional: She is oriented to person, place, and time. She appears well-developed and well-nourished. No distress.  HENT:  Head: Normocephalic and atraumatic.  Right Ear: External ear normal.  Left Ear: External ear normal.  Eyes: Conjunctivae and EOM are normal. Pupils are equal, round, and reactive to light.  Neck: Normal range of motion and phonation normal. Neck supple.  Cardiovascular: Normal rate, regular rhythm and normal heart sounds.   Pulmonary/Chest: Effort normal and breath sounds normal. She exhibits no bony tenderness.  Abdominal: Soft. There is no tenderness.  Musculoskeletal: Normal range of motion.  Neurological: She is alert and oriented to person, place, and time. No cranial nerve deficit or sensory deficit. She exhibits normal muscle tone. Coordination normal.  No dysarthria, aphasia or nystagmus.  Skin: Skin is warm, dry and intact.  Psychiatric: She has a normal  mood and affect. Her behavior is normal. Judgment and thought content normal.  Nursing note and vitals reviewed.   ED Course  Procedures (including critical care time)   Medications - No data to display  Patient Vitals for the past 24 hrs:  BP Temp Temp src Pulse Resp SpO2  03/17/16 0015 (!) 176/109 mmHg - - 73 17 100 %  03/16/16 2106 (!) 182/102 mmHg 99.1 F (37.3 C) Oral 84 14 100 %  03/16/16 1847 (!) 178/108 mmHg 98.5 F (36.9 C) Oral 87 18 100 %    12:30 AM Reevaluation with update and  discussion. After initial assessment and treatment, an updated evaluation reveals no change in clinical status. Findings discussed with patient and all questions were answered. [place] Review Labs Reviewed  BASIC METABOLIC PANEL - Abnormal; Notable for the following:    Glucose, Bld 107 (*)    All other components within normal limits  CBC  I-STAT TROPOININ, ED    Imaging Review Dg Chest 2 View  03/16/2016  CLINICAL DATA:  Acute onset of generalized chest pain and shortness of breath. Initial encounter. EXAM: CHEST  2 VIEW COMPARISON:  Chest radiograph performed earlier today at 5:19 p.m. FINDINGS: The lungs are well-aerated and clear. There is no evidence of focal opacification, pleural effusion or pneumothorax. The heart is normal in size; the mediastinal contour is within normal limits. No acute osseous abnormalities are seen. IMPRESSION: No acute cardiopulmonary process seen. Electronically Signed   By: Garald Balding M.D.   On: 03/16/2016 20:17   I have personally reviewed and evaluated these images and lab results as part of my medical decision-making.   EKG Interpretation   Date/Time:  Tuesday March 17 2016 00:10:39 EDT Ventricular Rate:  82 PR Interval:  114 QRS Duration: 80 QT Interval:  373 QTC Calculation: 436 R Axis:   21 Text Interpretation:  Sinus rhythm Low voltage, precordial leads  Borderline repolarization abnormality Since last tracing of earlier today  No significant change was found Confirmed by Amea Mcphail  MD, Thierry Dobosz [id number]) on  03/17/2016 12:40:10 AM      MDM   Final diagnoses:  Secondary hypertension, unspecified     Mild hypertension, with noncardiac chest pain. Doubt hypertensive urgency, metabolic instability or impending vascular collapse.  Nursing Notes Reviewed/ Care Coordinated Applicable Imaging Reviewed Interpretation of Laboratory Data incorporated into ED treatment  The patient appears reasonably screened and/or stabilized  for discharge and I doubt any other medical condition or other [facility] requiring further screening, evaluation, or treatment in the ED at this time prior to discharge.  Plan: Home Medications- Norvasc; Home Treatments- rest, low-salt diet; return here if the recommended treatment, does not improve the symptoms; Recommended follow up- PCP checkup one week.     Daleen Bo, MD 03/17/16 [id number]  Daleen Bo, MD 03/17/16 0040

## 2017-09-09 ENCOUNTER — Ambulatory Visit: Payer: BLUE CROSS/BLUE SHIELD | Admitting: Obstetrics & Gynecology

## 2017-09-09 ENCOUNTER — Encounter: Payer: Self-pay | Admitting: Obstetrics & Gynecology

## 2017-09-09 VITALS — BP 140/86 | Ht 64.0 in | Wt 183.0 lb

## 2017-09-09 DIAGNOSIS — N949 Unspecified condition associated with female genital organs and menstrual cycle: Secondary | ICD-10-CM | POA: Diagnosis not present

## 2017-09-09 DIAGNOSIS — D219 Benign neoplasm of connective and other soft tissue, unspecified: Secondary | ICD-10-CM

## 2017-09-09 DIAGNOSIS — R102 Pelvic and perineal pain: Secondary | ICD-10-CM

## 2017-09-09 DIAGNOSIS — R300 Dysuria: Secondary | ICD-10-CM

## 2017-09-09 LAB — WET PREP FOR TRICH, YEAST, CLUE

## 2017-09-09 MED ORDER — FLUCONAZOLE 150 MG PO TABS: 150.0000 mg | ORAL_TABLET | Freq: Every day | ORAL | 1 refills | Status: AC

## 2017-09-09 NOTE — Patient Instructions (Signed)
1. Dysuria Per urine analysis probably no cystitis.  Will wait on Urine Culture before deciding on treatment. - Urinalysis with Culture Reflex  2. Vaginal burning Yeast vaginitis on wet prep.  Fluconazole prescribed.  Usage reviewed.  Recommend probiotic tablet vaginally once a week as needed for prevention. - WET PREP FOR TRICH, YEAST, CLUE  3. Pelvic pain in female Possibly associated with gas in bowels.  Will rule out gynecologic pathology with pelvic ultrasound at follow-up given h/O Uterine Fibroids.  Robotic Myomectomies 2014. - US Transvaginal Non-OB; Future  4. Fibroid GYN exam today compatible with relatively small uterine fibroids.  Given the history of large uterine fibroids with robotic myomectomies in 2014, will reassess by pelvic ultrasound. - US Transvaginal Non-OB; Future  Follow-up pelvic ultrasound and annual gynecologic exam.  Kathryn Sexton, it was a pleasure seeing you today!  I will see you again soon.   Vaginal Yeast infection, Adult Vaginal yeast infection is a condition that causes soreness, swelling, and redness (inflammation) of the vagina. It also causes vaginal discharge. This is a common condition. Some women get this infection frequently. What are the causes? This condition is caused by a change in the normal balance of the yeast (candida) and bacteria that live in the vagina. This change causes an overgrowth of yeast, which causes the inflammation. What increases the risk? This condition is more likely to develop in:  Women who take antibiotic medicines.  Women who have diabetes.  Women who take birth control pills.  Women who are pregnant.  Women who douche often.  Women who have a weak defense (immune) system.  Women who have been taking steroid medicines for a long time.  Women who frequently wear tight clothing.  What are the signs or symptoms? Symptoms of this condition include:  White, thick vaginal discharge.  Swelling, itching,  redness, and irritation of the vagina. The lips of the vagina (vulva) may be affected as well.  Pain or a burning feeling while urinating.  Pain during sex.  How is this diagnosed? This condition is diagnosed with a medical history and physical exam. This will include a pelvic exam. Your health care provider will examine a sample of your vaginal discharge under a microscope. Your health care provider may send this sample for testing to confirm the diagnosis. How is this treated? This condition is treated with medicine. Medicines may be over-the-counter or prescription. You may be told to use one or more of the following:  Medicine that is taken orally.  Medicine that is applied as a cream.  Medicine that is inserted directly into the vagina (suppository).  Follow these instructions at home:  Take or apply over-the-counter and prescription medicines only as told by your health care provider.  Do not have sex until your health care provider has approved. Tell your sex partner that you have a yeast infection. That person should go to his or her health care provider if he or she develops symptoms.  Do not wear tight clothes, such as pantyhose or tight pants.  Avoid using tampons until your health care provider approves.  Eat more yogurt. This may help to keep your yeast infection from returning.  Try taking a sitz bath to help with discomfort. This is a warm water bath that is taken while you are sitting down. The water should only come up to your hips and should cover your buttocks. Do this 3-4 times per day or as told by your health care provider.  Do not  douche.  Wear breathable, cotton underwear.  If you have diabetes, keep your blood sugar levels under control. Contact a health care provider if:  You have a fever.  Your symptoms go away and then return.  Your symptoms do not get better with treatment.  Your symptoms get worse.  You have new symptoms.  You develop  blisters in or around your vagina.  You have blood coming from your vagina and it is not your menstrual period.  You develop pain in your abdomen. This information is not intended to replace advice given to you by your health care provider. Make sure you discuss any questions you have with your health care provider. Document Released: 06/24/2005 Document Revised: 02/26/2016 Document Reviewed: 03/18/2015 Elsevier Interactive Patient Education  2018 [employer].

## 2017-09-09 NOTE — Progress Notes (Signed)
    Kathryn Sexton September 27, 1971 [id number]        46 y.o.  G1P1 Married  RP: Pelvic discomfort with pain on urination and vaginal burning for a few days  HPI: Normal menstrual periods every month, normal flow.  History of robotic myomectomies in 2014.  Denies constant pelvic pain.  Currently experiencing pelvic discomfort with pain on urination and burning in the vagina.  No fever.  Bowel movements normal and regular.  Past medical history,surgical history, problem list, medications, allergies, family history and social history were all reviewed and documented in the EPIC chart.  Directed ROS with pertinent positives and negatives documented in the history of present illness/assessment and plan.  Exam:  Vitals:   09/09/17 1001  BP: 140/86  Weight: 183 lb (83 kg)  Height: 5\' 4"  (1.626 m)   General appearance:  Normal  Abdomen: Soft, nondistended.  Mildly tender at the suprapubic area.  CVAT negative bilaterally  Gyn exam: Vulva normal.  Speculum exam: Cervix and vagina normal.  Mild increase in vaginal discharge.  Wet prep done.  Bimanual exam: Anteverted uterus mildly increased in volume about 9 cm with small hard nodularity, mildly tender, mobile.  No adnexal mass, nontender.   U/A: White blood cells 0-5, red blood cells 0-2, few bacteria, few yeast, nitrites negative.  Wet prep with yeast present.   Assessment/Plan:  46 y.o. G1P1   1. Dysuria Per urine analysis probably no cystitis.  Will wait on Urine Culture before deciding on treatment. - Urinalysis with Culture Reflex  2. Vaginal burning Yeast vaginitis on wet prep.  Fluconazole prescribed.  Usage reviewed.  Recommend probiotic tablet vaginally once a week as needed for prevention. - WET PREP FOR TRICH, YEAST, CLUE  3. Pelvic pain in female Possibly associated with gas in bowels.  Will rule out gynecologic pathology with pelvic ultrasound at follow-up given h/O Uterine Fibroids.  Robotic Myomectomies 2014. - US  Transvaginal Non-OB; Future  4. Fibroid GYN exam today compatible with relatively small uterine fibroids.  Given the history of large uterine fibroids with robotic myomectomies in 2014, will reassess by pelvic ultrasound. - US Transvaginal Non-OB; Future  Follow-up pelvic ultrasound and annual gynecologic exam.  Counseling on above issues more than 50% for 25 minutes.  [name] MD, 10:18 AM 09/09/2017

## 2017-09-11 LAB — CULTURE INDICATED

## 2017-09-11 LAB — URINALYSIS W MICROSCOPIC + REFLEX CULTURE
Bilirubin Urine: NEGATIVE
Glucose, UA: NEGATIVE
HYALINE CAST: NONE SEEN /LPF
KETONES UR: NEGATIVE
Leukocyte Esterase: NEGATIVE
NITRITES URINE, INITIAL: NEGATIVE
PROTEIN: NEGATIVE
SPECIFIC GRAVITY, URINE: 1.015 (ref 1.001–1.03)
pH: 6.5 (ref 5.0–8.0)

## 2017-09-11 LAB — URINE CULTURE
MICRO NUMBER:: 81407734
SPECIMEN QUALITY:: ADEQUATE

## 2017-09-13 ENCOUNTER — Other Ambulatory Visit: Payer: Self-pay | Admitting: Obstetrics & Gynecology

## 2017-09-13 MED ORDER — AMOXICILLIN 500 MG PO CAPS: 500.0000 mg | ORAL_CAPSULE | Freq: Three times a day (TID) | ORAL | 0 refills | Status: DC

## 2017-09-14 ENCOUNTER — Telehealth: Payer: Self-pay | Admitting: *Deleted

## 2017-09-14 NOTE — Telephone Encounter (Signed)
Pt called left message c/o vaginal itching after completing 3 days dose of diflucan 150 mg tablet. I left message for pt to call to see if she took the refill of this as well.

## 2017-09-15 NOTE — Telephone Encounter (Signed)
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 They only cover 3 pills per month. Pt is still having internal itching asked if a cream could be prescribed? Please advise

## 2017-09-16 NOTE — Telephone Encounter (Signed)
Please send generic of Terazol 3.  1 applicator vaginally at bedtime x 3 nights.

## 2017-09-17 MED ORDER — TERCONAZOLE 0.8 % VA CREA: 1.0000 | TOPICAL_CREAM | Freq: Every day | VAGINAL | 0 refills | Status: DC

## 2017-09-17 NOTE — Telephone Encounter (Signed)
Left message with patient son, Rx sent.

## 2017-09-24 ENCOUNTER — Ambulatory Visit: Payer: Self-pay | Admitting: Obstetrics & Gynecology

## 2017-10-06 ENCOUNTER — Ambulatory Visit (INDEPENDENT_AMBULATORY_CARE_PROVIDER_SITE_OTHER): Payer: BLUE CROSS/BLUE SHIELD

## 2017-10-06 ENCOUNTER — Other Ambulatory Visit: Payer: Self-pay | Admitting: Obstetrics & Gynecology

## 2017-10-06 ENCOUNTER — Ambulatory Visit (INDEPENDENT_AMBULATORY_CARE_PROVIDER_SITE_OTHER): Payer: BLUE CROSS/BLUE SHIELD | Admitting: Obstetrics & Gynecology

## 2017-10-06 ENCOUNTER — Encounter: Payer: Self-pay | Admitting: Obstetrics & Gynecology

## 2017-10-06 VITALS — BP 150/98 | Ht 64.0 in | Wt 184.0 lb

## 2017-10-06 DIAGNOSIS — R350 Frequency of micturition: Secondary | ICD-10-CM

## 2017-10-06 DIAGNOSIS — D219 Benign neoplasm of connective and other soft tissue, unspecified: Secondary | ICD-10-CM

## 2017-10-06 DIAGNOSIS — Z01411 Encounter for gynecological examination (general) (routine) with abnormal findings: Secondary | ICD-10-CM | POA: Diagnosis not present

## 2017-10-06 DIAGNOSIS — R102 Pelvic and perineal pain: Secondary | ICD-10-CM | POA: Diagnosis not present

## 2017-10-06 DIAGNOSIS — D251 Intramural leiomyoma of uterus: Secondary | ICD-10-CM | POA: Diagnosis not present

## 2017-10-06 DIAGNOSIS — B3731 Acute candidiasis of vulva and vagina: Secondary | ICD-10-CM

## 2017-10-06 DIAGNOSIS — B373 Candidiasis of vulva and vagina: Secondary | ICD-10-CM

## 2017-10-06 DIAGNOSIS — Z3009 Encounter for other general counseling and advice on contraception: Secondary | ICD-10-CM | POA: Diagnosis not present

## 2017-10-06 LAB — URINALYSIS W MICROSCOPIC + REFLEX CULTURE
BACTERIA UA: NONE SEEN /HPF
Bilirubin Urine: NEGATIVE
Glucose, UA: NEGATIVE
HYALINE CAST: NONE SEEN /LPF
Hgb urine dipstick: NEGATIVE
Ketones, ur: NEGATIVE
Leukocyte Esterase: NEGATIVE
Nitrites, Initial: NEGATIVE
Protein, ur: NEGATIVE
RBC / HPF: NONE SEEN /HPF (ref 0–2)
Specific Gravity, Urine: 1.01 (ref 1.001–1.03)
WBC, UA: NONE SEEN /HPF (ref 0–5)
pH: 6.5 (ref 5.0–8.0)

## 2017-10-06 MED ORDER — FLUCONAZOLE 150 MG PO TABS: 150.0000 mg | ORAL_TABLET | Freq: Every day | ORAL | 2 refills | Status: AC

## 2017-10-06 MED ORDER — TERCONAZOLE 0.8 % VA CREA: 1.0000 | TOPICAL_CREAM | Freq: Every day | VAGINAL | 2 refills | Status: AC

## 2017-10-06 NOTE — Patient Instructions (Signed)
1. Encounter for gynecological examination with abnormal finding Gynecologic exam with increased uterine size due to fibroids.  Pap test with high risk HPV done.  Breast exam normal.  Will schedule screening mammogram.  Health labs with family physician. - PAP,TP IMGw/HPV RNA,rflx [id number],22/97  2. Fibroids, intramural Mild increase in good size of uterine fibroids.  But still basically asymptomatic.  Decision to observe. Very small left ovarian cyst measuring 1.8 cm, with a small solid focus but negative color flow Doppler, benign appearance, patient reassured.  3. Urinary frequency - Urinalysis with Culture Reflex  4. Encounter for other general counseling or advice on contraception Will use condoms.  5. Yeast vaginitis Recurrent yeast vaginitis.  No current symptoms.  Fluconazole and terconazole sent to pharmacy at patient's request to use as needed in the future.  Counseling on above issues more than 50% for 15 minutes. Other orders - fluconazole (DIFLUCAN) 150 MG tablet; Take 1 tablet (150 mg total) by mouth daily for 3 days. - terconazole (TERAZOL 3) 0.8 % vaginal cream; Place 1 applicator vaginally at bedtime for 3 days.  Kathryn Sexton, it was a pleasure seeing you today!  I will inform you of your results as soon as they are available.  Health Maintenance, Female Adopting a healthy lifestyle and getting preventive care can go a long way to promote health and wellness. Talk with your health care provider about what schedule of regular examinations is right for you. This is a good chance for you to check in with your provider about disease prevention and staying healthy. In between checkups, there are plenty of things you can do on your own. Experts have done a lot of research about which lifestyle changes and preventive measures are most likely to keep you healthy. Ask your health care provider for more information. Weight and diet Eat a healthy diet  Be sure to include plenty of  vegetables, fruits, low-fat dairy products, and lean protein.  Do not eat a lot of foods high in solid fats, added sugars, or salt.  Get regular exercise. This is one of the most important things you can do for your health. ? Most adults should exercise for at least 150 minutes each week. The exercise should increase your heart rate and make you sweat (moderate-intensity exercise). ? Most adults should also do strengthening exercises at least twice a week. This is in addition to the moderate-intensity exercise.  Maintain a healthy weight  Body mass index (BMI) is a measurement that can be used to identify possible weight problems. It estimates body fat based on height and weight. Your health care provider can help determine your BMI and help you achieve or maintain a healthy weight.  For females 68 years of age and older: ? A BMI below 18.5 is considered underweight. ? A BMI of 18.5 to 24.9 is normal. ? A BMI of 25 to 29.9 is considered overweight. ? A BMI of 30 and above is considered obese.  Watch levels of cholesterol and blood lipids  You should start having your blood tested for lipids and cholesterol at 47 years of age, then have this test every 5 years.  You may need to have your cholesterol levels checked more often if: ? Your lipid or cholesterol levels are high. ? You are older than 47 years of age. ? You are at high risk for heart disease.  Cancer screening Lung Cancer  Lung cancer screening is recommended for adults 89-54 years old who are at high risk  for lung cancer because of a history of smoking.  A yearly low-dose CT scan of the lungs is recommended for people who: ? Currently smoke. ? Have quit within the past 15 years. ? Have at least a 30-pack-year history of smoking. A pack year is smoking an average of one pack of cigarettes a day for 1 year.  Yearly screening should continue until it has been 15 years since you quit.  Yearly screening should stop if you  develop a health problem that would prevent you from having lung cancer treatment.  Breast Cancer  Practice breast self-awareness. This means understanding how your breasts normally appear and feel.  It also means doing regular breast self-exams. Let your health care provider know about any changes, no matter how small.  If you are in your 20s or 30s, you should have a clinical breast exam (CBE) by a health care provider every 1-3 years as part of a regular health exam.  If you are 89 or older, have a CBE every year. Also consider having a breast X-ray (mammogram) every year.  If you have a family history of breast cancer, talk to your health care provider about genetic screening.  If you are at high risk for breast cancer, talk to your health care provider about having an MRI and a mammogram every year.  Breast cancer gene (BRCA) assessment is recommended for women who have family members with BRCA-related cancers. BRCA-related cancers include: ? Breast. ? Ovarian. ? Tubal. ? Peritoneal cancers.  Results of the assessment will determine the need for genetic counseling and BRCA1 and BRCA2 testing.  Cervical Cancer Your health care provider may recommend that you be screened regularly for cancer of the pelvic organs (ovaries, uterus, and vagina). This screening involves a pelvic examination, including checking for microscopic changes to the surface of your cervix (Pap test). You may be encouraged to have this screening done every 3 years, beginning at age 29.  For women ages 23-65, health care providers may recommend pelvic exams and Pap testing every 3 years, or they may recommend the Pap and pelvic exam, combined with testing for human papilloma virus (HPV), every 5 years. Some types of HPV increase your risk of cervical cancer. Testing for HPV may also be done on women of any age with unclear Pap test results.  Other health care providers may not recommend any screening for nonpregnant  women who are considered low risk for pelvic cancer and who do not have symptoms. Ask your health care provider if a screening pelvic exam is right for you.  If you have had past treatment for cervical cancer or a condition that could lead to cancer, you need Pap tests and screening for cancer for at least 20 years after your treatment. If Pap tests have been discontinued, your risk factors (such as having a new sexual partner) need to be reassessed to determine if screening should resume. Some women have medical problems that increase the chance of getting cervical cancer. In these cases, your health care provider may recommend more frequent screening and Pap tests.  Colorectal Cancer  This type of cancer can be detected and often prevented.  Routine colorectal cancer screening usually begins at 47 years of age and continues through 47 years of age.  Your health care provider may recommend screening at an earlier age if you have risk factors for colon cancer.  Your health care provider may also recommend using home test kits to check for hidden blood  in the stool.  A small camera at the end of a tube can be used to examine your colon directly (sigmoidoscopy or colonoscopy). This is done to check for the earliest forms of colorectal cancer.  Routine screening usually begins at age 24.  Direct examination of the colon should be repeated every 5-10 years through 47 years of age. However, you may need to be screened more often if early forms of precancerous polyps or small growths are found.  Skin Cancer  Check your skin from head to toe regularly.  Tell your health care provider about any new moles or changes in moles, especially if there is a change in a mole's shape or color.  Also tell your health care provider if you have a mole that is larger than the size of a pencil eraser.  Always use sunscreen. Apply sunscreen liberally and repeatedly throughout the day.  Protect yourself by  wearing long sleeves, pants, a wide-brimmed hat, and sunglasses whenever you are outside.  Heart disease, diabetes, and high blood pressure  High blood pressure causes heart disease and increases the risk of stroke. High blood pressure is more likely to develop in: ? People who have blood pressure in the high end of the normal range (130-139/85-89 mm Hg). ? People who are overweight or obese. ? People who are African American.  If you are 41-100 years of age, have your blood pressure checked every 3-5 years. If you are 22 years of age or older, have your blood pressure checked every year. You should have your blood pressure measured twice-once when you are at a hospital or clinic, and once when you are not at a hospital or clinic. Record the average of the two measurements. To check your blood pressure when you are not at a hospital or clinic, you can use: ? An automated blood pressure machine at a pharmacy. ? A home blood pressure monitor.  If you are between 71 years and 54 years old, ask your health care provider if you should take aspirin to prevent strokes.  Have regular diabetes screenings. This involves taking a blood sample to check your fasting blood sugar level. ? If you are at a normal weight and have a low risk for diabetes, have this test once every three years after 47 years of age. ? If you are overweight and have a high risk for diabetes, consider being tested at a younger age or more often. Preventing infection Hepatitis B  If you have a higher risk for hepatitis B, you should be screened for this virus. You are considered at high risk for hepatitis B if: ? You were born in a country where hepatitis B is common. Ask your health care provider which countries are considered high risk. ? Your parents were born in a high-risk country, and you have not been immunized against hepatitis B (hepatitis B vaccine). ? You have HIV or AIDS. ? You use needles to inject street drugs. ? You  live with someone who has hepatitis B. ? You have had sex with someone who has hepatitis B. ? You get hemodialysis treatment. ? You take certain medicines for conditions, including cancer, organ transplantation, and autoimmune conditions.  Hepatitis C  Blood testing is recommended for: ? Everyone born from [birth year] through 1965. ? Anyone with known risk factors for hepatitis C.  Sexually transmitted infections (STIs)  You should be screened for sexually transmitted infections (STIs) including gonorrhea and chlamydia if: ? You are sexually active and  are younger than 47 years of age. ? You are older than 47 years of age and your health care provider tells you that you are at risk for this type of infection. ? Your sexual activity has changed since you were last screened and you are at an increased risk for chlamydia or gonorrhea. Ask your health care provider if you are at risk.  If you do not have HIV, but are at risk, it may be recommended that you take a prescription medicine daily to prevent HIV infection. This is called pre-exposure prophylaxis (PrEP). You are considered at risk if: ? You are sexually active and do not regularly use condoms or know the HIV status of your partner(s). ? You take drugs by injection. ? You are sexually active with a partner who has HIV.  Talk with your health care provider about whether you are at high risk of being infected with HIV. If you choose to begin PrEP, you should first be tested for HIV. You should then be tested every 3 months for as long as you are taking PrEP. Pregnancy  If you are premenopausal and you may become pregnant, ask your health care provider about preconception counseling.  If you may become pregnant, take 400 to 800 micrograms (mcg) of folic acid every day.  If you want to prevent pregnancy, talk to your health care provider about birth control (contraception). Osteoporosis and menopause  Osteoporosis is a disease in which the  bones lose minerals and strength with aging. This can result in serious bone fractures. Your risk for osteoporosis can be identified using a bone density scan.  If you are 76 years of age or older, or if you are at risk for osteoporosis and fractures, ask your health care provider if you should be screened.  Ask your health care provider whether you should take a calcium or vitamin D supplement to lower your risk for osteoporosis.  Menopause may have certain physical symptoms and risks.  Hormone replacement therapy may reduce some of these symptoms and risks. Talk to your health care provider about whether hormone replacement therapy is right for you. Follow these instructions at home:  Schedule regular health, dental, and eye exams.  Stay current with your immunizations.  Do not use any tobacco products including cigarettes, chewing tobacco, or electronic cigarettes.  If you are pregnant, do not drink alcohol.  If you are breastfeeding, limit how much and how often you drink alcohol.  Limit alcohol intake to no more than 1 drink per day for nonpregnant women. One drink equals 12 ounces of beer, 5 ounces of wine, or 1 ounces of hard liquor.  Do not use street drugs.  Do not share needles.  Ask your health care provider for help if you need support or information about quitting drugs.  Tell your health care provider if you often feel depressed.  Tell your health care provider if you have ever been abused or do not feel safe at home. This information is not intended to replace advice given to you by your health care provider. Make sure you discuss any questions you have with your health care provider. Document Released: 03/30/2011 Document Revised: 02/20/2016 Document Reviewed: 06/18/2015 Elsevier Interactive Patient Education  [employer].

## 2017-10-06 NOTE — Progress Notes (Signed)
Kathryn Sexton 10-19-1970 [id number]   History:    47 y.o. G1P1L1 Married.  69 yo child.  RP:  Established patient presenting for annual gyn exam and Pelvic US f/u Fibroids  HPI: Last menstrual period September 20, 2017.  Normal periods every months with normal flow.  Mild feeling of pelvic pressure, but no pelvic pain.  Normal vaginal secretions.  Using condoms for contraception.  Urine and bowel movements normal.  Breasts normal.  Health labs with family physician.  Past medical history,surgical history, family history and social history were all reviewed and documented in the EPIC chart.  Gynecologic History Patient's last menstrual period was 09/20/2017. Contraception: condoms Last Pap: 02/2014. Results were: normal Last mammogram: 02/2016. Results were: normal  Obstetric History OB History  Gravida Para Term Preterm AB Living  1 1       1   SAB TAB Ectopic Multiple Live Births               # Outcome Date GA Lbr Len/2nd Weight Sex Delivery Anes PTL Lv  1 Para                ROS: A ROS was performed and pertinent positives and negatives are included in the history.  GENERAL: No fevers or chills. HEENT: No change in vision, no earache, sore throat or sinus congestion. NECK: No pain or stiffness. CARDIOVASCULAR: No chest pain or pressure. No palpitations. PULMONARY: No shortness of breath, cough or wheeze. GASTROINTESTINAL: No abdominal pain, nausea, vomiting or diarrhea, melena or bright red blood per rectum. GENITOURINARY: No urinary frequency, urgency, hesitancy or dysuria. MUSCULOSKELETAL: No joint or muscle pain, no back pain, no recent trauma. DERMATOLOGIC: No rash, no itching, no lesions. ENDOCRINE: No polyuria, polydipsia, no heat or cold intolerance. No recent change in weight. HEMATOLOGICAL: No anemia or easy bruising or bleeding. NEUROLOGIC: No headache, seizures, numbness, tingling or weakness. PSYCHIATRIC: No depression, no loss of interest in normal activity or change  in sleep pattern.     Exam:   BP (!) 150/98 (BP Location: Right Arm, Patient Position: Sitting, Cuff Size: Normal)   Ht 5\' 4"  (1.626 m)   Wt 184 lb (83.5 kg)   LMP 09/20/2017   BMI 31.58 kg/m   Body mass index is 31.58 kg/m.  General appearance : Well developed well nourished female. No acute distress HEENT: Eyes: no retinal hemorrhage or exudates,  Neck supple, trachea midline, no carotid bruits, no thyroidmegaly Lungs: Clear to auscultation, no rhonchi or wheezes, or rib retractions  Heart: Regular rate and rhythm, no murmurs or gallops Breast:Examined in sitting and supine position were symmetrical in appearance, no palpable masses or tenderness,  no skin retraction, no nipple inversion, no nipple discharge, no skin discoloration, no axillary or supraclavicular lymphadenopathy Abdomen: no palpable masses or tenderness, no rebound or guarding Extremities: no edema or skin discoloration or tenderness  Pelvic: Vulva normal  B[place]olin, Urethra, Skene Glands: Within normal limits             Vagina: No gross lesions or discharge  Cervix: No gross lesions or discharge  Uterus  Enlarged with fibroids, non-tender and mobile  Adnexa  Without masses or tenderness  Anus and perineum  normal    Pelvic US today: T/V and T/A images.  Anteverted uterus measuring 12.23 x 8.70 x 7.15 cm.  Endometrial lining normal at 6.9 mm.  Intramural fibroids measuring 6.0 x 4.1 x 5.5 cm, 2.3 x 2.1 cm, 3.2 x 2.8 cm, 3.6 x  2.7 cm.  Right ovary normal.  Left ovary with a small cyst measuring 1.2 x 1.8 cm.  Echogenic focus within the cyst measuring 5 mm.  Negative color flow Doppler.  No free fluid in posterior cul-de-sac.  U/A today:  Completely negative   Assessment/Plan:  47 y.o. female for annual exam   1. Encounter for gynecological examination with abnormal finding Gynecologic exam with increased uterine size due to fibroids.  Pap test with high risk HPV done.  Breast exam normal.  Will schedule  screening mammogram.  Health labs with family physician. - PAP,TP IMGw/HPV RNA,rflx [id number],19/41  2. Fibroids, intramural Mild increase in good size of uterine fibroids.  But still basically asymptomatic.  Decision to observe. Very small left ovarian cyst measuring 1.8 cm, with a small solid focus but negative color flow Doppler, benign appearance, patient reassured.  3. Urinary frequency - Urinalysis with Culture Reflex  4. Encounter for other general counseling or advice on contraception Will use condoms.  5. Yeast vaginitis Recurrent yeast vaginitis.  No current symptoms.  Fluconazole and terconazole sent to pharmacy at patient's request to use as needed in the future.  Other orders - fluconazole (DIFLUCAN) 150 MG tablet; Take 1 tablet (150 mg total) by mouth daily for 3 days. - terconazole (TERAZOL 3) 0.8 % vaginal cream; Place 1 applicator vaginally at bedtime for 3 days.  Counseling on above issues more than 50% for 15 minutes.  [name] MD, 3:30 PM 10/06/2017

## 2017-10-08 LAB — PAP, TP IMAGING W/ HPV RNA, RFLX HPV TYPE 16,18/45: HPV DNA High Risk: NOT DETECTED

## 2018-08-05 ENCOUNTER — Other Ambulatory Visit: Payer: Self-pay | Admitting: Obstetrics & Gynecology

## 2018-08-05 DIAGNOSIS — Z1231 Encounter for screening mammogram for malignant neoplasm of breast: Secondary | ICD-10-CM

## 2018-08-15 ENCOUNTER — Ambulatory Visit
Admission: RE | Admit: 2018-08-15 | Discharge: 2018-08-15 | Disposition: A | Discharge status: Home / Self Care | Payer: BLUE CROSS/BLUE SHIELD | Source: Ambulatory Visit | Attending: Obstetrics & Gynecology | Admitting: Obstetrics & Gynecology

## 2018-08-15 DIAGNOSIS — Z1231 Encounter for screening mammogram for malignant neoplasm of breast: Secondary | ICD-10-CM

## 2018-08-17 ENCOUNTER — Other Ambulatory Visit: Payer: Self-pay | Admitting: Obstetrics & Gynecology

## 2018-08-17 DIAGNOSIS — R928 Other abnormal and inconclusive findings on diagnostic imaging of breast: Secondary | ICD-10-CM

## 2018-08-24 ENCOUNTER — Other Ambulatory Visit: Payer: Self-pay | Admitting: Obstetrics & Gynecology

## 2018-08-24 ENCOUNTER — Ambulatory Visit
Admission: RE | Admit: 2018-08-24 | Discharge: 2018-08-24 | Disposition: A | Discharge status: Home / Self Care | Payer: BLUE CROSS/BLUE SHIELD | Source: Ambulatory Visit | Attending: Obstetrics & Gynecology | Admitting: Obstetrics & Gynecology

## 2018-08-24 ENCOUNTER — Ambulatory Visit: Payer: BLUE CROSS/BLUE SHIELD

## 2018-08-24 DIAGNOSIS — N63 Unspecified lump in unspecified breast: Secondary | ICD-10-CM

## 2018-08-24 DIAGNOSIS — R928 Other abnormal and inconclusive findings on diagnostic imaging of breast: Secondary | ICD-10-CM

## 2018-09-02 ENCOUNTER — Other Ambulatory Visit: Payer: Self-pay | Admitting: Obstetrics & Gynecology

## 2018-09-02 ENCOUNTER — Ambulatory Visit
Admission: RE | Admit: 2018-09-02 | Discharge: 2018-09-02 | Disposition: A | Discharge status: Home / Self Care | Payer: BLUE CROSS/BLUE SHIELD | Source: Ambulatory Visit | Attending: Obstetrics & Gynecology | Admitting: Obstetrics & Gynecology

## 2018-09-02 DIAGNOSIS — N63 Unspecified lump in unspecified breast: Secondary | ICD-10-CM

## 2018-09-02 HISTORY — PX: BREAST BIOPSY: SHX20

## 2019-04-07 ENCOUNTER — Encounter: Payer: Self-pay | Admitting: Obstetrics & Gynecology

## 2019-04-07 ENCOUNTER — Other Ambulatory Visit: Payer: Self-pay

## 2019-04-07 ENCOUNTER — Ambulatory Visit: Payer: BC Managed Care – PPO | Admitting: Obstetrics & Gynecology

## 2019-04-07 VITALS — BP 142/86

## 2019-04-07 DIAGNOSIS — D219 Benign neoplasm of connective and other soft tissue, unspecified: Secondary | ICD-10-CM | POA: Diagnosis not present

## 2019-04-07 DIAGNOSIS — N920 Excessive and frequent menstruation with regular cycle: Secondary | ICD-10-CM | POA: Diagnosis not present

## 2019-04-07 DIAGNOSIS — N898 Other specified noninflammatory disorders of vagina: Secondary | ICD-10-CM

## 2019-04-07 LAB — CBC
HCT: 39.2 % (ref 35.0–45.0)
Hemoglobin: 13.3 g/dL (ref 11.7–15.5)
MCH: 27 pg (ref 27.0–33.0)
MCHC: 33.9 g/dL (ref 32.0–36.0)
MCV: 79.7 fL — ABNORMAL LOW (ref 80.0–100.0)
MPV: 10.8 fL (ref 7.5–12.5)
Platelets: 357 10*3/uL (ref 140–400)
RBC: 4.92 10*6/uL (ref 3.80–5.10)
RDW: 13.5 % (ref 11.0–15.0)
WBC: 10.3 10*3/uL (ref 3.8–10.8)

## 2019-04-07 LAB — WET PREP FOR TRICH, YEAST, CLUE

## 2019-04-07 MED ORDER — FLUCONAZOLE 150 MG PO TABS: 150.0000 mg | ORAL_TABLET | Freq: Every day | ORAL | 2 refills | Status: AC

## 2019-04-07 NOTE — Progress Notes (Signed)
    Tanaisha Pittman [birth year] [id number]        48 y.o.  G1P1L1 Married  RP: Vaginal itching worsening x 2 weeks  HPI: Vaginal itching worsening x 2 weeks.  Tried OTC Antifungal creams, but just applied on the vulva, symptoms did not resolve.  H/O Uterine Fibroids, last Pelvic US 09/2017.  C/O increased menstrual flow day 1-2 with painful cramps.  Menses every month, lasting 5 days. Not currently sexually active.  Uses condoms as needed.     OB History  Gravida Para Term Preterm AB Living  1 1       1   SAB TAB Ectopic Multiple Live Births               # Outcome Date GA Lbr Len/2nd Weight Sex Delivery Anes PTL Lv  1 Para             Past medical history,surgical history, problem list, medications, allergies, family history and social history were all reviewed and documented in the EPIC chart.   Directed ROS with pertinent positives and negatives documented in the history of present illness/assessment and plan.  Exam:  Vitals:   04/07/19 1159  BP: (!) 142/86   General appearance:  Normal  Abdomen: Normal  Gynecologic exam: Vulva mild inflammation.  Speculum:  Cervix/Vagina normal.  Mild increase in secretions.  Wet prep done.  Bimanual exam:  Uterus AV, increased in volume about 10 cm, hard fibroids, mobile, NT.  No adnexal mass, NT bilaterally.  Wet Prep: Yeasts present   Assessment/Plan:  48 y.o. G1P1   1. Vaginal itching Yeast vaginitis confirmed by wet prep.  Decision to treat with fluconazole 150 mg 1 tablet daily for 3 days.  Usage reviewed and prescription sent to pharmacy.  May use probiotic tablets vaginally once a week for prevention going forward. - WET PREP FOR [place], YEAST, CLUE  2. Menorrhagia with regular cycle Heavy periods probably associated with uterine fibroids.  Will check a CBC to rule out anemia and follow-up with a pelvic ultrasound to assess the uterus and endometrial lining. - US Transvaginal Non-OB; Future - CBC  3. Fibroid Probable uterine  fibroids per exam and history.  Follow-up pelvic ultrasound to evaluate.  Other orders - empagliflozin (JARDIANCE) 10 MG TABS tablet; Take 10 mg by mouth daily. - Potassium 99 MG TABS; Take by mouth. - fluconazole (DIFLUCAN) 150 MG tablet; Take 1 tablet (150 mg total) by mouth daily for 3 days.  Counseling on above issues and coordination of care more than 50% for 25 minutes.  [name] MD, 12:02 PM 04/07/2019

## 2019-04-10 ENCOUNTER — Other Ambulatory Visit: Payer: Self-pay | Admitting: General Surgery

## 2019-04-10 DIAGNOSIS — N6021 Fibroadenosis of right breast: Secondary | ICD-10-CM

## 2019-04-11 ENCOUNTER — Telehealth: Payer: Self-pay | Admitting: *Deleted

## 2019-04-11 ENCOUNTER — Ambulatory Visit: Payer: BLUE CROSS/BLUE SHIELD | Admitting: Obstetrics & Gynecology

## 2019-04-11 NOTE — Telephone Encounter (Signed)
Patient was treated with diflucan tablet 150 mg x 3 days at [facility] on 04/07/19, still has vaginal itching after completing medication. I explained she has refills on diflucan at the pharmacy, but patient thought maybe a cream would be better for external itching. Please advise

## 2019-04-11 NOTE — Telephone Encounter (Signed)
Can try Terazol 3, but insert in vagina and on vulva.

## 2019-04-12 MED ORDER — TERCONAZOLE 0.8 % VA CREA: 1.0000 | TOPICAL_CREAM | Freq: Every day | VAGINAL | 0 refills | Status: DC

## 2019-04-12 NOTE — Telephone Encounter (Signed)
Patient informed, Rx sent.  

## 2019-04-15 ENCOUNTER — Encounter: Payer: Self-pay | Admitting: Obstetrics & Gynecology

## 2019-04-15 NOTE — Patient Instructions (Signed)
1. Vaginal itching Yeast vaginitis confirmed by wet prep.  Decision to treat with fluconazole 150 mg 1 tablet daily for 3 days.  Usage reviewed and prescription sent to pharmacy.  May use probiotic tablets vaginally once a week for prevention going forward. - WET PREP FOR [place], YEAST, CLUE  2. Menorrhagia with regular cycle Heavy periods probably associated with uterine fibroids.  Will check a CBC to rule out anemia and follow-up with a pelvic ultrasound to assess the uterus and endometrial lining. - US Transvaginal Non-OB; Future - CBC  3. Fibroid Probable uterine fibroids per exam and history.  Follow-up pelvic ultrasound to evaluate.  Other orders - empagliflozin (JARDIANCE) 10 MG TABS tablet; Take 10 mg by mouth daily. - Potassium 99 MG TABS; Take by mouth. - fluconazole (DIFLUCAN) 150 MG tablet; Take 1 tablet (150 mg total) by mouth daily for 3 days.  [place]ak[place], it was a pleasure seeing you today!  I will inform you of your results as soon as they are available.

## 2019-04-17 ENCOUNTER — Telehealth: Payer: Self-pay | Admitting: *Deleted

## 2019-04-17 NOTE — Telephone Encounter (Signed)
Patient was treated at [facility] for yeast with diflucan 150 mg daily x 3 days on 04/07/19 symptoms did not improved called on 04/11/19 and was prescribed Terazol 3 day cream to use internally and externally. Patient called today still c/o external itching and irritation, when she used both medications symptoms are better,but when medication is finished symptoms return. Patient said itching and irritation is causing sleep issues at night. Per note " May use probiotic tablets vaginally once a week for prevention going forward." patient never started this since no improvement on symptoms. She asked for any other suggestions? Please advise

## 2019-04-18 NOTE — Telephone Encounter (Signed)
patient informed, transferred to appointment desk

## 2019-04-18 NOTE — Telephone Encounter (Signed)
Kathryn Sexton offered several days and times for patient and she did not schedule, reports not able to take the days offered.

## 2019-04-18 NOTE — Telephone Encounter (Signed)
Needs an appointment.

## 2019-05-01 ENCOUNTER — Ambulatory Visit
Admission: RE | Admit: 2019-05-01 | Discharge: 2019-05-01 | Disposition: A | Discharge status: Home / Self Care | Payer: PRIVATE HEALTH INSURANCE | Source: Ambulatory Visit | Attending: General Surgery | Admitting: General Surgery

## 2019-05-01 ENCOUNTER — Other Ambulatory Visit: Payer: Self-pay

## 2019-05-01 ENCOUNTER — Ambulatory Visit
Admission: RE | Admit: 2019-05-01 | Discharge: 2019-05-01 | Disposition: A | Discharge status: Home / Self Care | Payer: BLUE CROSS/BLUE SHIELD | Source: Ambulatory Visit | Attending: General Surgery | Admitting: General Surgery

## 2019-05-01 DIAGNOSIS — N6021 Fibroadenosis of right breast: Secondary | ICD-10-CM

## 2019-05-24 ENCOUNTER — Other Ambulatory Visit: Payer: Self-pay

## 2019-05-25 ENCOUNTER — Other Ambulatory Visit: Payer: Self-pay | Admitting: Obstetrics & Gynecology

## 2019-05-25 ENCOUNTER — Encounter: Payer: Self-pay | Admitting: Obstetrics & Gynecology

## 2019-05-25 ENCOUNTER — Ambulatory Visit: Payer: BC Managed Care – PPO | Admitting: Obstetrics & Gynecology

## 2019-05-25 ENCOUNTER — Ambulatory Visit (INDEPENDENT_AMBULATORY_CARE_PROVIDER_SITE_OTHER): Payer: BC Managed Care – PPO

## 2019-05-25 DIAGNOSIS — B373 Candidiasis of vulva and vagina: Secondary | ICD-10-CM | POA: Diagnosis not present

## 2019-05-25 DIAGNOSIS — D219 Benign neoplasm of connective and other soft tissue, unspecified: Secondary | ICD-10-CM

## 2019-05-25 DIAGNOSIS — N920 Excessive and frequent menstruation with regular cycle: Secondary | ICD-10-CM

## 2019-05-25 DIAGNOSIS — B3731 Acute candidiasis of vulva and vagina: Secondary | ICD-10-CM

## 2019-05-25 MED ORDER — FLUCONAZOLE 150 MG PO TABS: 150.0000 mg | ORAL_TABLET | Freq: Every day | ORAL | 3 refills | Status: AC

## 2019-05-25 NOTE — Patient Instructions (Signed)
1. Fibroid Pelvic ultrasound findings reviewed with patient.  Large uterine fibroids mildly increased in size since last ultrasound January 2019.  Endometrial lining thin and normal at 4.3 mm.  Normal ovaries.  Management discussed with patient including progestin pill, Depo-Lupron injections or surgery.  Decision to proceed with Depo-Lupron injections every 3 months x 2 for a 64-month treatment.  Risks benefits and usage reviewed with patient.  Menopausal symptoms on Depo-Lupron discussed with patient.  We will repeat a pelvic ultrasound at 6 months to reassess the uterine fibroids.  Patient voiced understanding and agreement with plan. - US Transvaginal Non-OB; Future  2. Menorrhagia with regular cycle Normal hemoglobin on April 07, 2019 at 13.3.  - US Transvaginal Non-OB; Future  3. Yeast vaginitis We will treat with fluconazole 1 tablet p.o. daily for 3 days.  Usage reviewed and prescription sent to pharmacy.  Other orders - fluconazole (DIFLUCAN) 150 MG tablet; Take 1 tablet (150 mg total) by mouth daily for 3 days.  Kathryn Sexton, it was a pleasure seeing you today!

## 2019-05-25 NOTE — Progress Notes (Signed)
    Kathryn Sexton 04-21-1971 [id number]        48 y.o.  G1P1L1   RP: Menorrhagia, dysmenorrhea, Fibroids for Pelvic US  HPI: Worsening menorrhagia with dysmenorrhea and pelvic discomfort associated with known uterine fibroids.  Also complains of vaginal itching recurring with her menstrual periods.   OB History  Gravida Para Term Preterm AB Living  1 1       1   SAB TAB Ectopic Multiple Live Births               # Outcome Date GA Lbr Len/2nd Weight Sex Delivery Anes PTL Lv  1 Para             Past medical history,surgical history, problem list, medications, allergies, family history and social history were all reviewed and documented in the EPIC chart.   Directed ROS with pertinent positives and negatives documented in the history of present illness/assessment and plan.  Exam:  There were no vitals filed for this visit. General appearance:  Normal  Pelvic US today: T/A images.  Comparisons with previous pelvic ultrasound January 2019.  Uterus is enlarged since previous scan with 8 fibroids seen today with the largest measuring 6.3 x 3.9 cm, 3.5 x 2.8 cm and 2.7 x 2.5 cm.  The overall uterine size is 14 cm x 10.76 cm x 6.22 cm.  Thin symmetrical endometrial lining measured at 4.3 mm.  Both ovaries are normal in size with sparse follicles.  No adnexal mass seen.  No free fluid in the posterior cul-de-sac.   Assessment/Plan:  48 y.o. G1P1   1. Fibroid Pelvic ultrasound findings reviewed with patient.  Large uterine fibroids mildly increased in size since last ultrasound January 2019.  Endometrial lining thin and normal at 4.3 mm.  Normal ovaries.  Management discussed with patient including progestin pill, Depo-Lupron injections or surgery.  Decision to proceed with Depo-Lupron injections every 3 months x 2 for a 36-month treatment.  Risks benefits and usage reviewed with patient.  Menopausal symptoms on Depo-Lupron discussed with patient.  We will repeat a pelvic ultrasound at 6  months to reassess the uterine fibroids.  Patient voiced understanding and agreement with plan. - US Transvaginal Non-OB; Future  2. Menorrhagia with regular cycle Normal hemoglobin on April 07, 2019 at 13.3.  - US Transvaginal Non-OB; Future  3. Yeast vaginitis We will treat with fluconazole 1 tablet p.o. daily for 3 days.  Usage reviewed and prescription sent to pharmacy.  Other orders - fluconazole (DIFLUCAN) 150 MG tablet; Take 1 tablet (150 mg total) by mouth daily for 3 days.  Counseling on above issues and coordination of care more than 50% for 15 minutes.  [name] MD, 11:34 AM 05/25/2019

## 2019-05-29 ENCOUNTER — Other Ambulatory Visit: Payer: Self-pay

## 2019-05-29 ENCOUNTER — Telehealth: Payer: Self-pay

## 2019-05-29 NOTE — Telephone Encounter (Signed)
As we wait for DepoLupron, patient has a choice between the Progestin-only BCP or DepoProvera injection.

## 2019-05-29 NOTE — Telephone Encounter (Signed)
[facility] called to let me know that Lupron 11.4m and Lupron 3.714mis on backorder.  Per the FDA page:  Lupron Dep[place], 1 Month 3.75 mg PDS Kit ([id number] backorder. The estimated duration is unknown at this time.  Delay in shipping of the drug  Lupron Depot, 3 Months 11.25 mg PDS Kit ([id number] backorder. The estimated duration is unknown at this time.  Delay in shipping of the drug

## 2019-05-30 ENCOUNTER — Encounter: Payer: Self-pay | Admitting: Anesthesiology

## 2019-05-30 MED ORDER — MEDROXYPROGESTERONE ACETATE 150 MG/ML IM SUSP: 150.0000 mg | Freq: Once | INTRAMUSCULAR | 0 refills | Status: DC

## 2019-05-30 NOTE — Telephone Encounter (Signed)
Spoke with patient and advised her of Lupron Depot situation. Offered options provided by Dr. Marguerita Merles.  Patient opts for Depo Provera but would like to proceed with Lupron as soon as available.

## 2019-06-06 ENCOUNTER — Telehealth: Payer: Self-pay

## 2019-06-06 NOTE — Telephone Encounter (Signed)
I spoke with patient to let her know that the rep from [facility] called this morning to let me know that they received word that the temporary shortage of Lupron should end around end of September. Patient has already picked up D-P inj but opts to wait.  I cancelled nurse appt for D-P inj. She knows that they will be in touch with her regarding ins benefits/her cost for her to approve shipment to [place].

## 2019-06-07 ENCOUNTER — Ambulatory Visit: Payer: Self-pay

## 2019-06-27 ENCOUNTER — Encounter: Payer: Self-pay | Admitting: Anesthesiology

## 2019-06-30 ENCOUNTER — Telehealth: Payer: Self-pay

## 2019-06-30 NOTE — Telephone Encounter (Signed)
I called [facility] to follow up on Lupron order as there had been a shortage that was hopefully to end at the end of Sept.  I spoke with R.J. who told me Lupron for patient was "on hold". They spoke with patient on 06/27/19 and she had a $550.00 co-paymt and she asked them not to ship.  She wanted to talk with her insurance co. First.  He did check with pharmacist and confirmed that they are having a long term out of stock issue with Lupron.

## 2019-07-19 ENCOUNTER — Telehealth: Payer: Self-pay

## 2019-07-19 NOTE — Telephone Encounter (Signed)
Patient called wanting to discuss cost of Lupron. I read her from the benefits sheet that was sent to me.  I explained that I have nothing to do with the insurance benefits as [facility] facilitates all this with Lupron.  I told her that new Rx has been sent to [facility] for Lupaneta and to hold on and let them run the benefits on that and see about cost. I will follow up with [facility] to make sure they received Rx faxed on 10/13 although I did receive confirmation.  I will follow up with patient as well.

## 2019-07-25 ENCOUNTER — Other Ambulatory Visit: Payer: Self-pay

## 2019-07-25 NOTE — Telephone Encounter (Signed)
I called to follow up on the [facility] I sent last Monday.  I spoke with a pharmacist who said it was received but evidently had not been processed. I gave him the verbal Rx for Lupaneta and he processed it. Now it will go to the billing team and they will check on insurance etc. And let patient know.  He said he would put an rush on it since I sent it a week ago.    I called patient and informed her someone would be calling her soon about cost.

## 2019-08-02 ENCOUNTER — Telehealth: Payer: Self-pay

## 2019-08-02 NOTE — Telephone Encounter (Signed)
Since Lupron was on backorder and we were told Lupaneta pack was available Rx was sent.   Today received fax that "Lupaneta is currently out of stock by manufacturer and is unavailable at this time". Lupron also unavailable indefinitely.  Did you want to make a different plan for her?

## 2019-08-02 NOTE — Telephone Encounter (Signed)
Her choices are the Progestin pill in the meantime or proceeding with surgery.Marland KitchenMarland Kitchen

## 2019-08-03 NOTE — Telephone Encounter (Signed)
Patient informed. She wants to consider and will let us know.

## 2019-12-17 IMAGING — MG DIGITAL DIAGNOSTIC BILATERAL MAMMOGRAM WITH TOMO AND CAD
8 series · 8 of 24 positions shown · non-contrast
Comparison: Previous exam(s).

CLINICAL DATA: Patient returns today to evaluate a possible RIGHT
breast mass and a possible LEFT breast asymmetry, both questioned on
recent screening mammogram.

EXAM:
DIGITAL DIAGNOSTIC BILATERAL MAMMOGRAM WITH CAD AND TOMO
ULTRASOUND RIGHT BREAST

[L ML synth-2D]
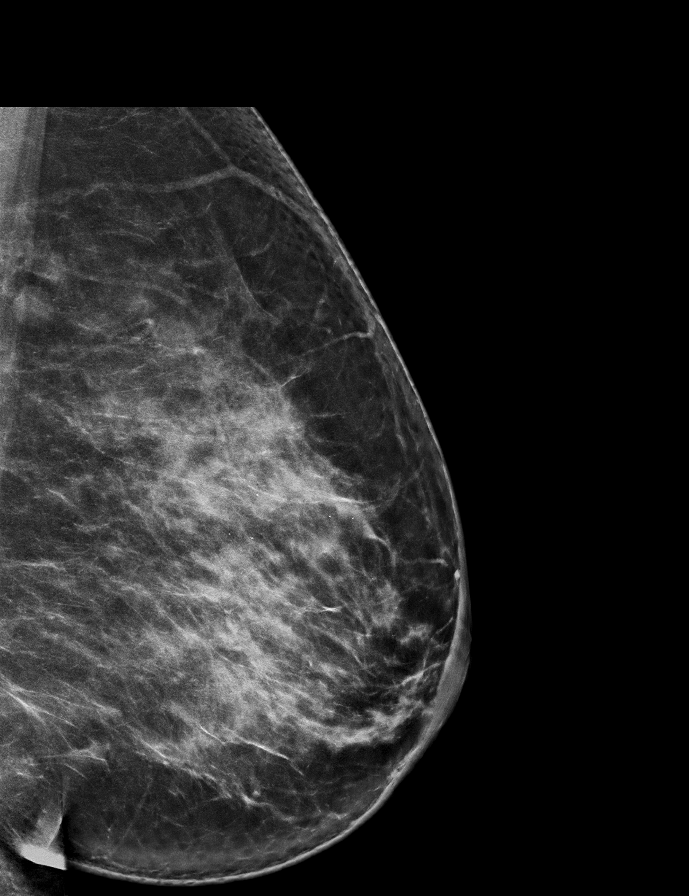

[R MLO synth-2D]
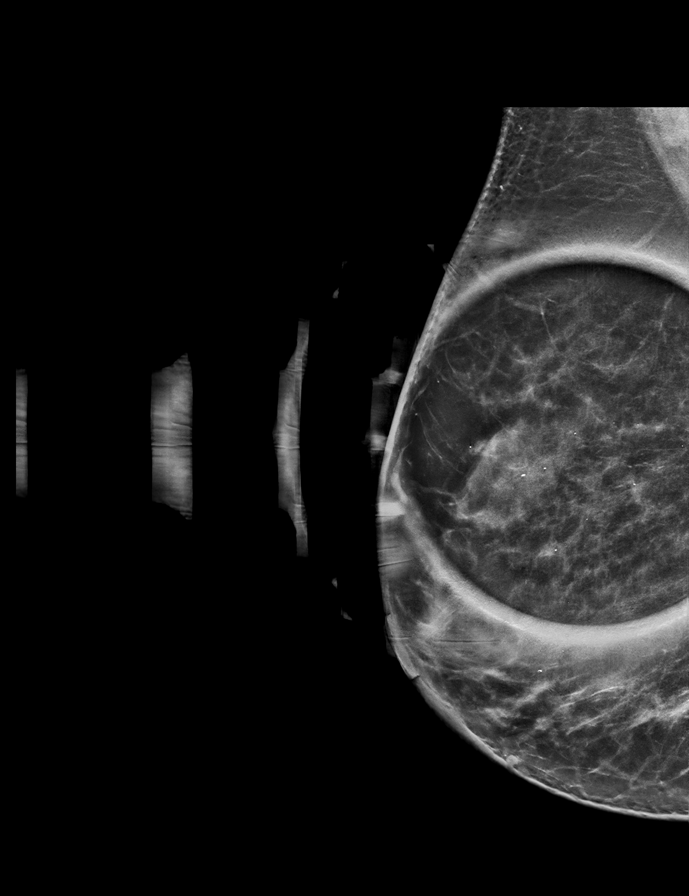

[L MLO synth-2D]
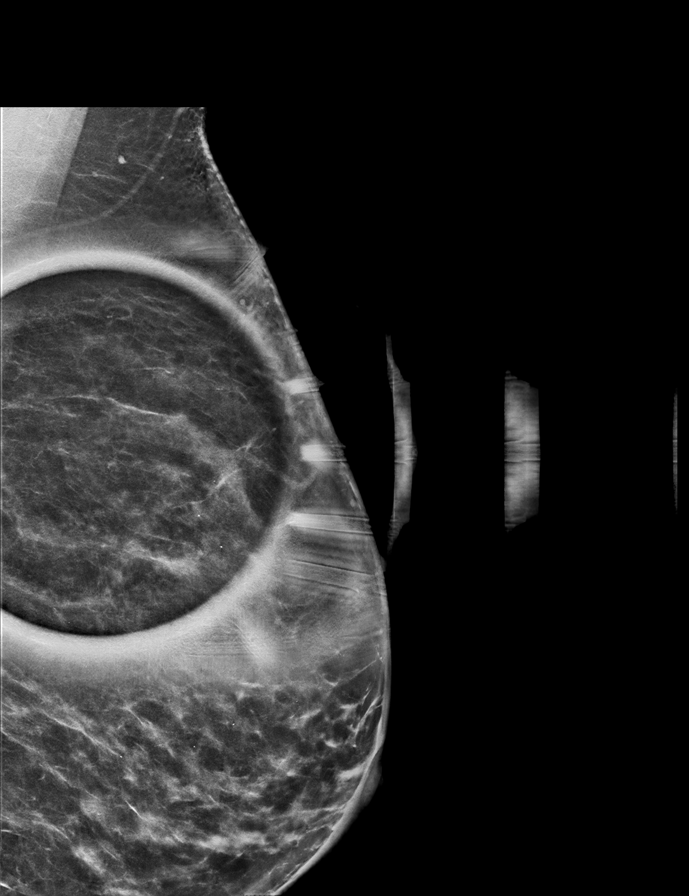

[R CC synth-2D]
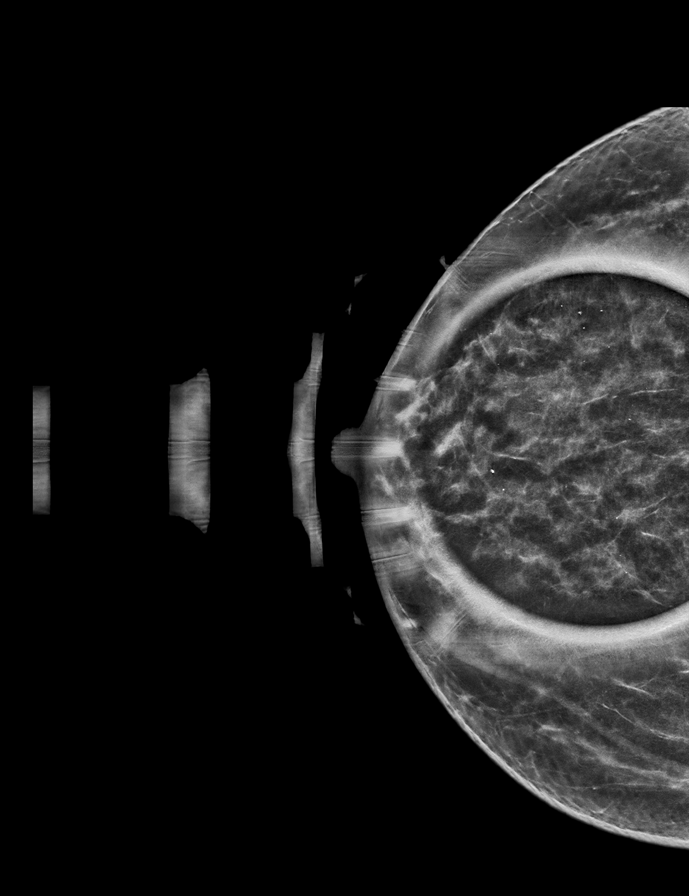

[L MLO tomo · tomo slice 42/83.0]
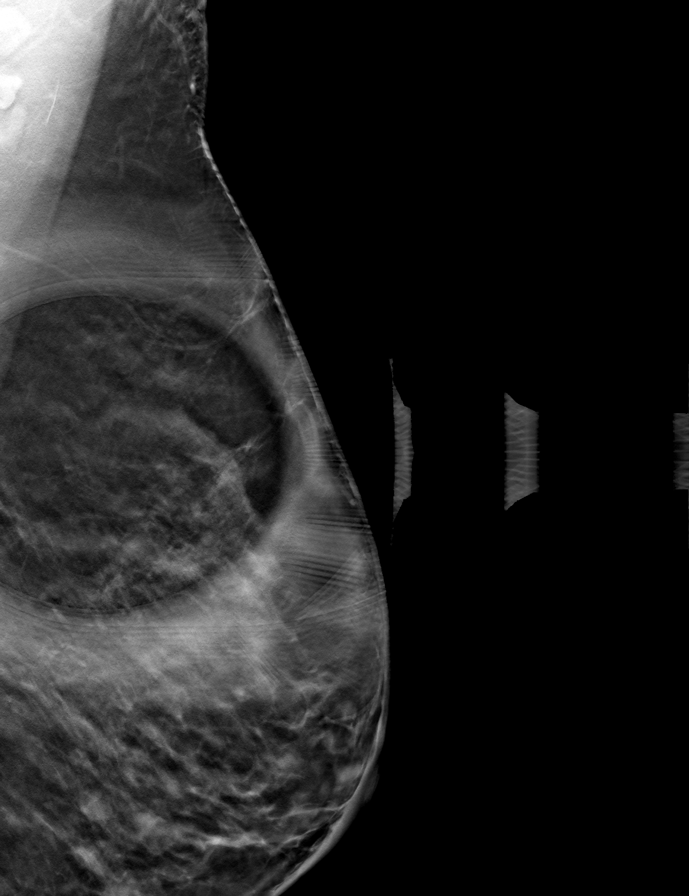

[L ML tomo · tomo slice 45/88.0]
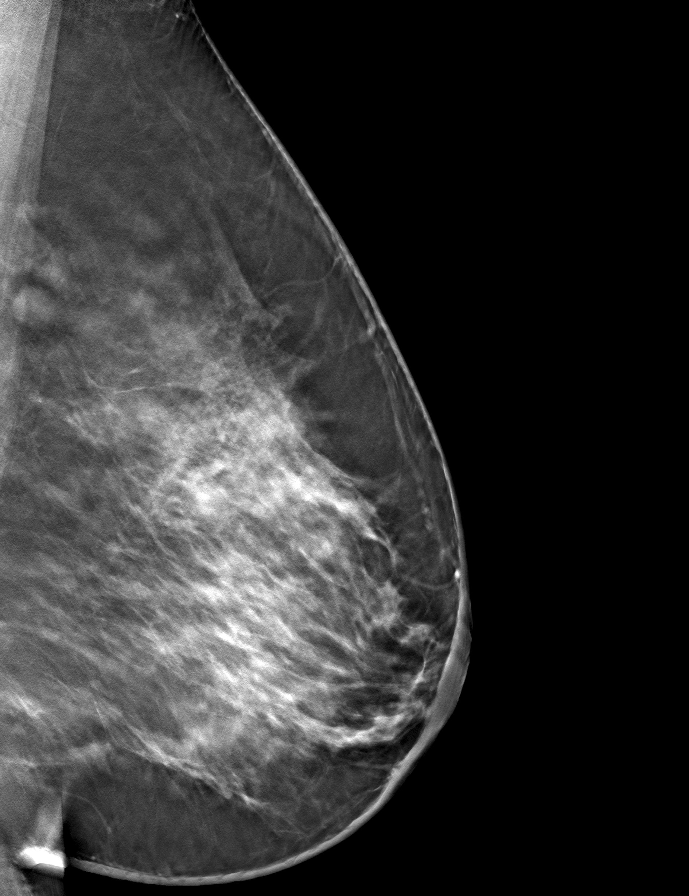

[R MLO tomo · tomo slice 39/77.0]
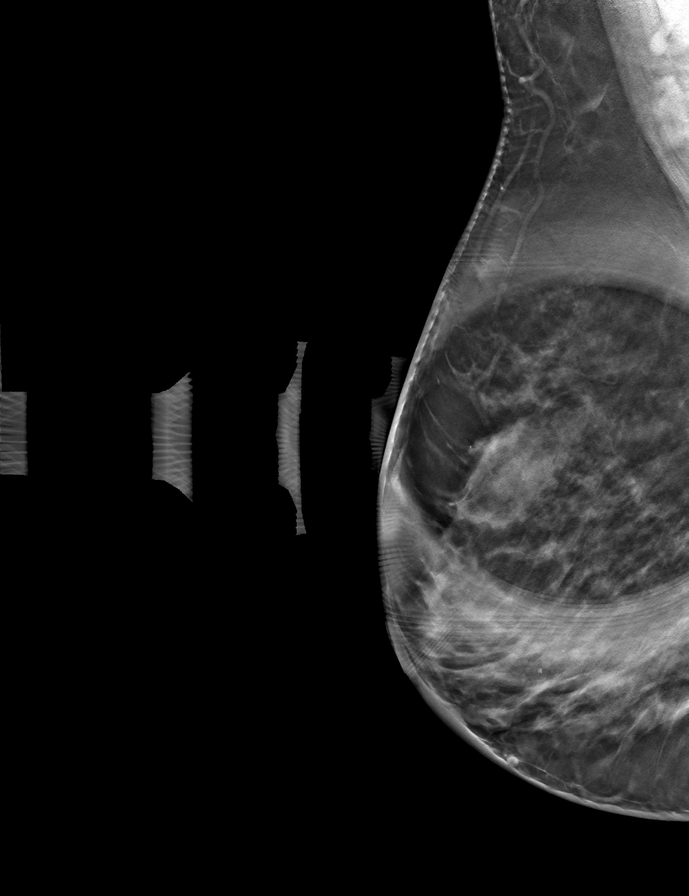

[R CC tomo · tomo slice 31/61.0]
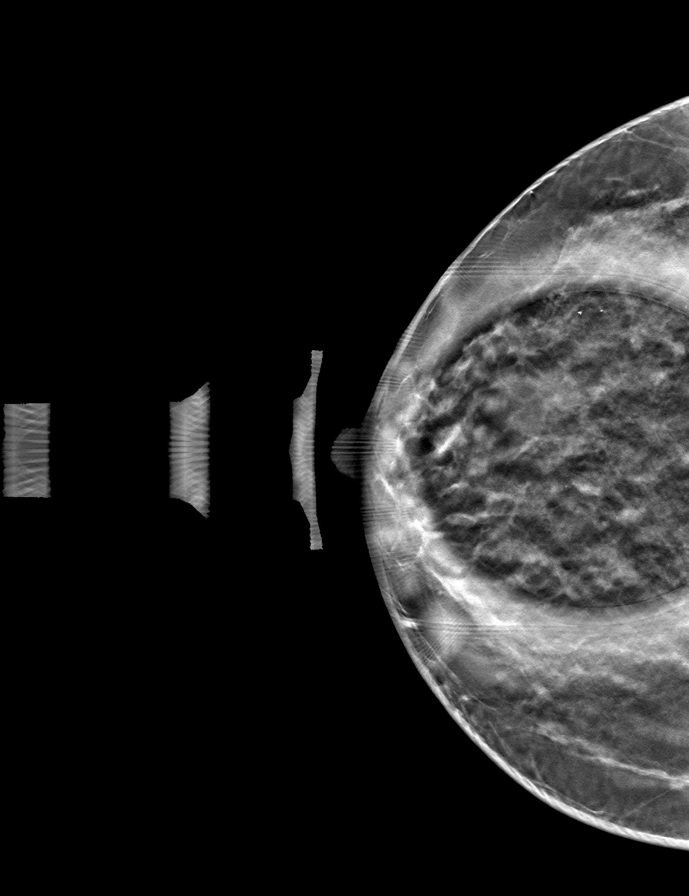

[8 of 24 positions shown; findings below may reference images not displayed]

ACR Breast Density Category c: The breast tissue is heterogeneously
dense, which may obscure small masses.
FINDINGS: RIGHT breast: There is a persistent partially obscured mass within
the upper RIGHT breast, measuring approximately 2.4 cm greatest
dimension.

LEFT breast: There is no persistent asymmetry within the upper LEFT
breast indicating superimposition of normal dense fibroglandular
tissues. There are no new dominant masses, suspicious calcifications
or secondary signs of malignancy identified within the LEFT breast
on today's exam

Mammographic images were processed with CAD.

Targeted ultrasound is performed, showing a simple cyst in the RIGHT
breast at the 12 o'clock axis, 2 cm from the nipple, measuring
cm.

There is an additional oval circumscribed mixed echogenicity mass in
the RIGHT breast at the 12 o'clock axis, 2 cm from the nipple,
measuring 1.3 x 0.6 x 0.9 cm, with internal cystic appearing foci,
avascular.

There is an additional irregular hypoechoic mass in the RIGHT breast
at the 11:30 o'clock axis, 3 cm from the nipple, with irregular and
indistinct margins, avascular, measuring 1 x 0.4 x 0.8 cm.
IMPRESSION: 1. Two indeterminate masses in the RIGHT breast for which
ultrasound-guided biopsies are recommended, as follows: (1) oval
circumscribed mixed echogenicity mass in the RIGHT breast at the 12
o'clock axis, 2 cm from the nipple, measuring 1.3 cm, with internal
cystic appearing foci; (2) irregular hypoechoic mass in the RIGHT
breast at the 11:30 o'clock axis, 3 cm from the nipple, with
irregular and indistinct margins, measuring 1 cm. These may
represent benign fibroadenomas and/or complicated/clustered cysts.
Ultrasound-guided biopsies are recommended to ensure benignity.
2. No evidence of malignancy within the LEFT breast.

RECOMMENDATION:
Ultrasound-guided biopsies of the indeterminate masses in the RIGHT
breast at the 12 o'clock axis and 11:30 o'clock axis, as detailed
above.

Ultrasound-guided biopsies are scheduled for [REDACTED].

I have discussed the findings and recommendations with the patient.
Results were also provided in writing at the conclusion of the
visit. If applicable, a reminder letter will be sent to the patient
regarding the next appointment.

BI-RADS CATEGORY  4: Suspicious.

## 2020-01-03 MED FILL — JARDIANCE 25 MG TABLET: 25 | 90 days supply | Qty: 90 | Fill #0

## 2020-01-04 ENCOUNTER — Other Ambulatory Visit (HOSPITAL_COMMUNITY): Payer: Self-pay | Admitting: Family Medicine

## 2020-01-04 MED FILL — AMLODIPINE BESYLATE 5 MG TA: 5 | 90 days supply | Qty: 90 | Fill #0

## 2020-01-04 MED FILL — HYDROCHLOROTHIAZIDE 25 MG T: 25 | 90 days supply | Qty: 90 | Fill #0

## 2020-01-04 MED FILL — ATORVASTATIN 20 MG TABLET: 20 | 90 days supply | Qty: 90 | Fill #0

## 2020-02-14 ENCOUNTER — Other Ambulatory Visit: Payer: Self-pay

## 2020-02-15 ENCOUNTER — Encounter: Payer: Self-pay | Admitting: Obstetrics & Gynecology

## 2020-02-15 ENCOUNTER — Ambulatory Visit (INDEPENDENT_AMBULATORY_CARE_PROVIDER_SITE_OTHER): Payer: No Typology Code available for payment source | Admitting: Obstetrics & Gynecology

## 2020-02-15 VITALS — BP 136/84 | Ht 63.75 in | Wt 180.0 lb

## 2020-02-15 DIAGNOSIS — D219 Benign neoplasm of connective and other soft tissue, unspecified: Secondary | ICD-10-CM

## 2020-02-15 DIAGNOSIS — R109 Unspecified abdominal pain: Secondary | ICD-10-CM | POA: Diagnosis not present

## 2020-02-15 DIAGNOSIS — N920 Excessive and frequent menstruation with regular cycle: Secondary | ICD-10-CM | POA: Diagnosis not present

## 2020-02-15 DIAGNOSIS — Z01419 Encounter for gynecological examination (general) (routine) without abnormal findings: Secondary | ICD-10-CM | POA: Diagnosis not present

## 2020-02-15 LAB — CBC
HCT: 40.7 % (ref 35.0–45.0)
Hemoglobin: 13.3 g/dL (ref 11.7–15.5)
MCH: 26.8 pg — ABNORMAL LOW (ref 27.0–33.0)
MCHC: 32.7 g/dL (ref 32.0–36.0)
MCV: 81.9 fL (ref 80.0–100.0)
MPV: 10.6 fL (ref 7.5–12.5)
Platelets: 351 10*3/uL (ref 140–400)
RBC: 4.97 10*6/uL (ref 3.80–5.10)
RDW: 13.6 % (ref 11.0–15.0)
WBC: 8.8 10*3/uL (ref 3.8–10.8)

## 2020-02-15 NOTE — Progress Notes (Signed)
Kathryn Sexton Aug 30, 1971 [id number]   History:    49 y.o. G1P1L1 Married.  34 yo child.  RP:  Established patient presenting for annual gyn exam and Pelvic US f/u Fibroids  HPI: Menstrual periods every month with heavier flow. Known large uterine fibroids. Previous Robotic Myomectomies.  Pelvic pressure worsening and feeling of being bloated.  Normal vaginal secretions.  Using condoms for contraception.  Urine and bowel movements normal.  Breasts normal. Health labs with family physician.  Past medical history,surgical history, family history and social history were all reviewed and documented in the EPIC chart.  Gynecologic History Patient's last menstrual period was 02/10/2020.  Obstetric History OB History  Gravida Para Term Preterm AB Living  1 1       1   SAB TAB Ectopic Multiple Live Births               # Outcome Date GA Lbr Len/2nd Weight Sex Delivery Anes PTL Lv  1 Para              ROS: A ROS was performed and pertinent positives and negatives are included in the history.  GENERAL: No fevers or chills. HEENT: No change in vision, no earache, sore throat or sinus congestion. NECK: No pain or stiffness. CARDIOVASCULAR: No chest pain or pressure. No palpitations. PULMONARY: No shortness of breath, cough or wheeze. GASTROINTESTINAL: No abdominal pain, nausea, vomiting or diarrhea, melena or bright red blood per rectum. GENITOURINARY: No urinary frequency, urgency, hesitancy or dysuria. MUSCULOSKELETAL: No joint or muscle pain, no back pain, no recent trauma. DERMATOLOGIC: No rash, no itching, no lesions. ENDOCRINE: No polyuria, polydipsia, no heat or cold intolerance. No recent change in weight. HEMATOLOGICAL: No anemia or easy bruising or bleeding. NEUROLOGIC: No headache, seizures, numbness, tingling or weakness. PSYCHIATRIC: No depression, no loss of interest in normal activity or change in sleep pattern.     Exam:   BP 136/84   Ht 5' 3.75" (1.619 m)   Wt 180 lb  (81.6 kg)   LMP 02/10/2020   BMI 31.14 kg/m   Body mass index is 31.14 kg/m.  General appearance : Well developed well nourished female. No acute distress HEENT: Eyes: no retinal hemorrhage or exudates,  Neck supple, trachea midline, no carotid bruits, no thyroidmegaly Lungs: Clear to auscultation, no rhonchi or wheezes, or rib retractions  Heart: Regular rate and rhythm, no murmurs or gallops Breast:Examined in sitting and supine position were symmetrical in appearance, no palpable masses or tenderness,  no skin retraction, no nipple inversion, no nipple discharge, no skin discoloration, no axillary or supraclavicular lymphadenopathy Abdomen: no palpable masses or tenderness, no rebound or guarding Extremities: no edema or skin discoloration or tenderness  Pelvic: Vulva: Normal             Vagina: No gross lesions or discharge  Cervix: No gross lesions or discharge  Uterus  AV, enlarged and nodular about 14 cm, non-tender and mobile  Adnexa  Without masses or tenderness  Anus: Normal  Pelvic US 04/2019: T/A images.  Comparisons with previous pelvic ultrasound January 2019.  Uterus is enlarged since previous scan with 8 fibroids seen today with the largest measuring 6.3 x 3.9 cm, 3.5 x 2.8 cm and 2.7 x 2.5 cm. The overall uterine size is 14 cm x 10.76 cm x 6.22 cm.  Thin symmetrical endometrial lining measured at 4.3 mm.  Both ovaries are normal in size with sparse follicles.  No adnexal mass seen.  No free  fluid in the posterior cul-de-sac.   Assessment/Plan:  49 y.o. female for annual exam   1. Well female exam with routine gynecological exam Gynecologic exam with a large myomatous uterus.  Pap test January 2019 was negative, no indication to repeat this year.  Breast exam normal.  Last screening mammogram was abnormal on the right.  A right diagnostic mammography with ultrasound were performed.  A right breast biopsy was benign.  Health labs with family physician.  2. Menorrhagia  with regular cycle Worsening menorrhagia with abdominal discomfort.  Large uterine fibroids.  Last pelvic ultrasound August 2020 measured the overall uterine size at 14 x 10.76 x 6.22 cm.  Will give 1 dose of 3 months Depo-Lupron to shrink the fibroids prior to surgery.  Decision to proceed with an XI Robotic TLH/Bilateral Salpingectomy.  Rule out anemia with a CBC today.  Follow-up preop.  Information and pamphlet on robotic hysterectomy given. - CBC  3. Fibroid As above.  4. Abdominal discomfort As above.  Other orders - empagliflozin (JARDIANCE) 25 MG TABS tablet; Take 25 mg by mouth daily.  [name] MD, 11:27 AM 02/15/2020

## 2020-02-16 ENCOUNTER — Encounter: Payer: Self-pay | Admitting: Obstetrics & Gynecology

## 2020-02-16 ENCOUNTER — Other Ambulatory Visit: Payer: Self-pay | Admitting: General Surgery

## 2020-02-16 DIAGNOSIS — N63 Unspecified lump in unspecified breast: Secondary | ICD-10-CM

## 2020-02-16 NOTE — Patient Instructions (Signed)
1. Well female exam with routine gynecological exam Gynecologic exam with a large myomatous uterus.  Pap test January 2019 was negative, no indication to repeat this year.  Breast exam normal.  Last screening mammogram was abnormal on the right.  A right diagnostic mammography with ultrasound were performed.  A right breast biopsy was benign.  Health labs with family physician.  2. Menorrhagia with regular cycle Worsening menorrhagia with abdominal discomfort.  Large uterine fibroids.  Last pelvic ultrasound August 2020 measured the overall uterine size at 14 x 10.76 x 6.22 cm.  Will give 1 dose of 3 months Depo-Lupron to shrink the fibroids prior to surgery.  Decision to proceed with an XI Robotic TLH/Bilateral Salpingectomy.  Rule out anemia with a CBC today.  Follow-up preop.  Information and pamphlet on robotic hysterectomy given. - CBC  3. Fibroid As above.  4. Abdominal discomfort As above.  Other orders - empagliflozin (JARDIANCE) 25 MG TABS tablet; Take 25 mg by mouth daily.  Jerlyn, it was a pleasure seeing you today!  I will inform you of your results as soon as they are available.

## 2020-02-19 ENCOUNTER — Telehealth: Payer: Self-pay

## 2020-02-19 NOTE — Telephone Encounter (Signed)
Robotic TLH, BSE scheduled 04/23/20.   Lupron order has been placed and I am waiting to hear about availability, etc.  Patient asked if she needed to have an ultrasound scheduled prior to surgery?

## 2020-02-20 ENCOUNTER — Telehealth: Payer: Self-pay

## 2020-02-20 NOTE — Telephone Encounter (Signed)
I was satisfied with my Gynecologic exam and Pelvic US 04/2019, but let's be more thorough and schedule a pelvic US within 2 weeks of surgery.

## 2020-02-20 NOTE — Telephone Encounter (Signed)
I spoke with Kathryn Sexton at [phone] and initiated PA for Lupron as requested by [employer].  She said it can take up to 72 business hours and to call back Friday if I have note heard from them.

## 2020-02-20 NOTE — Telephone Encounter (Signed)
I spoke with patient and scheduled her surgery for 04/23/20 at 8:30am at [facility].  We reviewed her ins benefits and that her ins covers physician fees for surgery 100%. No prepymt due.  We discussed the need for Covid testing prior to surgery and appt was scheduled accordingly. I advised her regarding quarantine protocol as well.\  Packet sent.

## 2020-02-21 ENCOUNTER — Other Ambulatory Visit: Payer: Self-pay

## 2020-02-21 DIAGNOSIS — D219 Benign neoplasm of connective and other soft tissue, unspecified: Secondary | ICD-10-CM

## 2020-02-21 NOTE — Telephone Encounter (Signed)
I staff messaged CLaudia and let her know and she will call patient back to arrange.

## 2020-02-21 NOTE — Telephone Encounter (Signed)
Rosemarie Ax confirmed that u/s has been scheduled pre-operatively.

## 2020-02-23 ENCOUNTER — Other Ambulatory Visit: Payer: Self-pay

## 2020-02-23 ENCOUNTER — Ambulatory Visit
Admission: RE | Admit: 2020-02-23 | Discharge: 2020-02-23 | Disposition: A | Discharge status: Home / Self Care | Payer: No Typology Code available for payment source | Source: Ambulatory Visit | Attending: General Surgery | Admitting: General Surgery

## 2020-02-23 ENCOUNTER — Other Ambulatory Visit: Payer: Self-pay | Admitting: General Surgery

## 2020-02-23 DIAGNOSIS — N63 Unspecified lump in unspecified breast: Secondary | ICD-10-CM

## 2020-02-28 ENCOUNTER — Telehealth: Payer: Self-pay

## 2020-02-28 NOTE — Telephone Encounter (Signed)
I submitted all requested information for prior authorization that was required for Lupron. Today I received notice that insurance co. Denied Lupron for this patient because she does not meet their criteria for covering it.  I did inform them you were hoping to decrease size of fibroids prior to scheduled surgery.  "For approval of Lupron Dept in female patinets, our guidelines requires that you have a diagnosis of moderate to severe pain from endometriosis, gender dysphoria, central precocious puberty or anemia caused by uterine leiomyoma. This request was denied because your condition does not meet the requirements for approval."

## 2020-03-01 NOTE — Telephone Encounter (Signed)
What can happen next according to what they sent me is for patient to file an appeal. Patient can appoint a provider to be her representative and file the appeal and she will have to call and arrange that.  Then for you to appeal their decision you will have to call or write a letter supporting your appeal of their decision.

## 2020-03-01 NOTE — Telephone Encounter (Signed)
Patient is skeptical that appeal would get her Lupron since she does not meet criteria. She said she would just like to proceed with surgery without it if you are okay with that and feel you can do her surgery safely.

## 2020-03-01 NOTE — Telephone Encounter (Signed)
Please submit again based on Moderate to Severe pain from Endometriosis and Fibroids.

## 2020-03-01 NOTE — Telephone Encounter (Signed)
Yes, feel fine doing the surgery without Lupron.  Likely to succeed with Robotic approach.  Will keep her safe.  Conversion to open approach if needed.

## 2020-03-01 NOTE — Telephone Encounter (Signed)
Spoke with patient and informed her. °

## 2020-03-01 NOTE — Telephone Encounter (Signed)
Ok, will let patient decide if wants to appeal.  If not, will plan surgery without Lupron first.

## 2020-03-05 ENCOUNTER — Encounter: Payer: BC Managed Care – PPO | Admitting: Obstetrics & Gynecology

## 2020-03-08 ENCOUNTER — Encounter: Payer: Self-pay | Admitting: Anesthesiology

## 2020-03-19 DIAGNOSIS — Z0289 Encounter for other administrative examinations: Secondary | ICD-10-CM

## 2020-03-25 MED FILL — HYDROCHLOROTHIAZIDE 25 MG T: 25 | 90 days supply | Qty: 90 | Fill #0

## 2020-03-25 MED FILL — ATORVASTATIN 20 MG TABLET: 20 | 90 days supply | Qty: 90 | Fill #0

## 2020-03-25 MED FILL — AMLODIPINE BESYLATE 5 MG TA: 5 | 90 days supply | Qty: 90 | Fill #0

## 2020-03-26 ENCOUNTER — Encounter: Payer: Self-pay | Admitting: Anesthesiology

## 2020-03-26 MED FILL — JARDIANCE 25 MG TABLET: 25 | 30 days supply | Qty: 30 | Fill #1

## 2020-03-26 MED FILL — RESTASIS 0.05% EYE EMULSION: 0.05 | 30 days supply | Qty: 60 | Fill #0

## 2020-04-02 ENCOUNTER — Telehealth: Payer: Self-pay

## 2020-04-02 NOTE — Telephone Encounter (Signed)
Patient called because she expects her period may start a few days before surgery and she questions if this will interfere with her surgery. I reassured her that will not be a problem.

## 2020-04-09 ENCOUNTER — Other Ambulatory Visit: Payer: Self-pay | Admitting: *Deleted

## 2020-04-09 DIAGNOSIS — D219 Benign neoplasm of connective and other soft tissue, unspecified: Secondary | ICD-10-CM

## 2020-04-17 NOTE — Progress Notes (Signed)
COVID Vaccine Completed: Date COVID Vaccine completed: COVID vaccine manufacturer: Pfizer    Moderna   [employer]   PCP - Dr. Andria Frames Cardiologist -   Chest x-ray - greater than 1 year EKG - greater than 1 year Stress Test -  ECHO -  Cardiac Cath -   Sleep Study -  CPAP -   Fasting Blood Sugar -  Checks Blood Sugar _____ times a day  Blood Thinner Instructions: Aspirin Instructions: Last Dose:  Anesthesia review:   Patient denies shortness of breath, fever, cough and chest pain at PAT appointment   Patient verbalized understanding of instructions that were given to them at the PAT appointment. Patient was also instructed that they will need to review over the PAT instructions again at home before surgery.

## 2020-04-17 NOTE — Patient Instructions (Addendum)
DUE TO COVID-19 ONLY ONE VISITOR IS ALLOWED IN WAITING ROOM (VISITOR WILL HAVE A TEMPERATURE CHECK ON ARRIVAL AND MUST WEAR A FACE MASK THE ENTIRE TIME.)  ONCE YOU ARE ADMITTED TO YOUR PRIVATE ROOM, THE SAME ONE VISITOR IS ALLOWED TO VISIT DURING VISITING HOURS ONLY.  Your COVID swab testing is scheduled for  Friday, 04-19-20 at 3 PM , You must self quarantine after your testing per handout given to you at the testing site.  ([place] up testing enter pre-surgical testing line)    Your procedure is scheduled on: 04-23-20   Report to [facility].   Call this number if you have problems the morning of surgery:[phone].   OUR ADDRESS IS [place].  WE ARE LOCATED IN [facility].                                     REMEMBER:  DO NOT EAT FOOD AFTER MIDNIGHT .    BRUSH YOUR TEETH THE MORNING OF SURGERY.  TAKE THESE MEDICATIONS MORNING OF SURGERY WITH A SIP OF WATER:  None  DO NOT WEAR JEWERLY, MAKE UP, OR NAIL POLISH.  DO NOT WEAR LOTIONS, POWDERS, PERFUMES OR DEODORANT.  DO NOT SHAVE FOR 24 HOURS PRIOR TO DAY OF SURGERY.  CONTACTS, GLASSES, OR DENTURES MAY NOT BE WORN TO SURGERY.                                    [facility] IS NOT RESPONSIBLE  FOR ANY BELONGINGS.          BRING ALL PRESCRIPTION MEDICATIONS WITH YOU THE DAY OF SURGERY IN ORIGINAL CONTAINERS                                             YOU MAY HAVE CLEAR LIQUIDS UNTIL 5:30 AM THE DAY OF SURGERY.                      CLEAR LIQUID DIET  Foods Allowed                                                                     Foods Excluded  Water, Black Coffee and tea, regular and decaf               liquids that you cannot  Plain Jell-O in any flavor  (No red)                                      see through such as: Fruit ices (not with fruit pulp)  milk, soups, orange  juice  Iced Popsicles (No red)                                     All solid food                                   Apple juices Sports drinks like Gatorade (No red) Lightly seasoned clear broth or consume(fat free) Sugar, honey syrup       Bring small overnight bag day of surgery.                Please read over the following fact sheets you were given: IF YOU HAVE QUESTIONS ABOUT YOUR PRE OP [place] [phone]   How to Manage Your Diabetes Before and After Surgery  Why is it important to control my blood sugar before and after surgery? . Improving blood sugar levels before and after surgery helps healing and can limit problems. . A way of improving blood sugar control is eating a healthy diet by: o  Eating less sugar and carbohydrates o  Increasing activity/exercise o  Talking with your doctor about reaching your blood sugar goals . High blood sugars (greater than 180 mg/dL) can raise your risk of infections and slow your recovery, so you will need to focus on controlling your diabetes during the weeks before surgery. . Make sure that the doctor who takes care of your diabetes knows about your planned surgery including the date and location.  How do I manage my blood sugar before surgery? . Check your blood sugar at least 4 times a day, starting 2 days before surgery, to make sure that the level is not too high or low. o Check your blood sugar the morning of your surgery when you wake up and every 2 hours until you get to the Short Stay unit. . If your blood sugar is less than 70 mg/dL, you will need to treat for low blood sugar: o Do not take insulin. o Treat a low blood sugar (less than 70 mg/dL) with  cup of clear juice (cranberry or apple), 4 glucose tablets, OR glucose gel. o Recheck blood sugar in 15 minutes after treatment (to make sure it is greater than 70 mg/dL). If your blood sugar is not greater than 70 mg/dL on recheck, call [phone] for further  instructions. . Report your blood sugar to the short stay nurse when you get to Short Stay.  . If you are admitted to the hospital after surgery: o Your blood sugar will be checked by the staff and you will probably be given insulin after surgery (instead of oral diabetes medicines) to make sure you have good blood sugar levels. o The goal for blood sugar control after surgery is 80-180 mg/dL.   WHAT DO I DO ABOUT MY DIABETES MEDICATION?  Marland Kitchen Do not take oral diabetes medicines (pills) the morning of surgery.  . THE DAY BEFORE SURGERY, DO NOT TAKE JARDIANCE     Reviewed and Endorsed by [place] Patient Education Committee, August 2015   [facility] - Preparing for Surgery Before surgery, you can play an important role.  Because skin is not sterile, your skin needs to be as free of germs as possible.  You can reduce the number of germs on your skin by washing with CHG (  chlorahexidine gluconate) soap before surgery.  CHG is an antiseptic cleaner which kills germs and bonds with the skin to continue killing germs even after washing. Please DO NOT use if you have an allergy to CHG or antibacterial soaps.  If your skin becomes reddened/irritated stop using the CHG and inform your nurse when you arrive at Short Stay. Do not shave (including legs and underarms) for at least 48 hours prior to the first CHG shower.  You may shave your face/neck.  Please follow these instructions carefully:  1.  Shower with CHG Soap the night before surgery and the  morning of surgery.  2.  If you choose to wash your hair, wash your hair first as usual with your normal  shampoo.  3.  After you shampoo, rinse your hair and body thoroughly to remove the shampoo.                             4.  Use CHG as you would any other liquid soap.  You can apply chg directly to the skin and wash.  Gently with a scrungie or clean washcloth.  5.  Apply the CHG Soap to your body ONLY FROM THE NECK DOWN.   Do   not use on face/  open                           Wound or open sores. Avoid contact with eyes, ears mouth and   genitals (private parts).                       Wash face,  Genitals (private parts) with your normal soap.             6.  Wash thoroughly, paying special attention to the area where your    surgery  will be performed.  7.  Thoroughly rinse your body with warm water from the neck down.  8.  DO NOT shower/wash with your normal soap after using and rinsing off the CHG Soap.                9.  Pat yourself dry with a clean towel.            10.  Wear clean pajamas.            11.  Place clean sheets on your bed the night of your first shower and do not  sleep with pets. Day of Surgery : Do not apply any lotions/deodorants the morning of surgery.  Please wear clean clothes to the hospital/surgery center.  FAILURE TO FOLLOW THESE INSTRUCTIONS MAY RESULT IN THE CANCELLATION OF YOUR SURGERY  PATIENT SIGNATURE_________________________________  NURSE SIGNATURE__________________________________  ________________________________________________________________________  WHAT IS A BLOOD TRANSFUSION? Blood Transfusion Information  A transfusion is the replacement of blood or some of its parts. Blood is made up of multiple cells which provide different functions.  Red blood cells carry oxygen and are used for blood loss replacement.  White blood cells fight against infection.  Platelets control bleeding.  Plasma helps clot blood.  Other blood products are available for specialized needs, such as hemophilia or other clotting disorders. BEFORE THE TRANSFUSION  Who gives blood for transfusions?   Healthy volunteers who are fully evaluated to make sure their blood is safe. This is blood bank blood. Transfusion therapy is the safest it has ever been in the  practice of medicine. Before blood is taken from a donor, a complete history is taken to make sure that person has no history of diseases nor engages in  risky social behavior (examples are intravenous drug use or sexual activity with multiple partners). The donor's travel history is screened to minimize risk of transmitting infections, such as malaria. The donated blood is tested for signs of infectious diseases, such as HIV and hepatitis. The blood is then tested to be sure it is compatible with you in order to minimize the chance of a transfusion reaction. If you or a relative donates blood, this is often done in anticipation of surgery and is not appropriate for emergency situations. It takes many days to process the donated blood. RISKS AND COMPLICATIONS Although transfusion therapy is very safe and saves many lives, the main dangers of transfusion include:   Getting an infectious disease.  Developing a transfusion reaction. This is an allergic reaction to something in the blood you were given. Every precaution is taken to prevent this. The decision to have a blood transfusion has been considered carefully by your caregiver before blood is given. Blood is not given unless the benefits outweigh the risks. AFTER THE TRANSFUSION  Right after receiving a blood transfusion, you will usually feel much better and more energetic. This is especially true if your red blood cells have gotten low (anemic). The transfusion raises the level of the red blood cells which carry oxygen, and this usually causes an energy increase.  The nurse administering the transfusion will monitor you carefully for complications. HOME CARE INSTRUCTIONS  No special instructions are needed after a transfusion. You may find your energy is better. Speak with your caregiver about any limitations on activity for underlying diseases you may have. SEEK MEDICAL CARE IF:   Your condition is not improving after your transfusion.  You develop redness or irritation at the intravenous (IV) site. SEEK IMMEDIATE MEDICAL CARE IF:  Any of the following symptoms occur over the next 12  hours:  Shaking chills.  You have a temperature by mouth above 102 F (38.9 C), not controlled by medicine.  Chest, back, or muscle pain.  People around you feel you are not acting correctly or are confused.  Shortness of breath or difficulty breathing.  Dizziness and fainting.  You get a rash or develop hives.  You have a decrease in urine output.  Your urine turns a dark color or changes to pink, red, or brown. Any of the following symptoms occur over the next 10 days:  You have a temperature by mouth above 102 F (38.9 C), not controlled by medicine.  Shortness of breath.  Weakness after normal activity.  The white part of the eye turns yellow (jaundice).  You have a decrease in the amount of urine or are urinating less often.  Your urine turns a dark color or changes to pink, red, or brown. Document Released: 09/11/2000 Document Revised: 12/07/2011 Document Reviewed: 04/30/2008 [facility] Patient Information 2014 [place], [place].  _______________________________________________________________________

## 2020-04-18 ENCOUNTER — Telehealth: Payer: No Typology Code available for payment source | Admitting: Obstetrics & Gynecology

## 2020-04-18 ENCOUNTER — Other Ambulatory Visit: Payer: Self-pay

## 2020-04-18 ENCOUNTER — Ambulatory Visit: Payer: No Typology Code available for payment source | Admitting: Obstetrics & Gynecology

## 2020-04-18 ENCOUNTER — Ambulatory Visit (INDEPENDENT_AMBULATORY_CARE_PROVIDER_SITE_OTHER): Payer: No Typology Code available for payment source

## 2020-04-18 ENCOUNTER — Encounter: Payer: Self-pay | Admitting: Obstetrics & Gynecology

## 2020-04-18 DIAGNOSIS — N920 Excessive and frequent menstruation with regular cycle: Secondary | ICD-10-CM | POA: Diagnosis not present

## 2020-04-18 DIAGNOSIS — D219 Benign neoplasm of connective and other soft tissue, unspecified: Secondary | ICD-10-CM | POA: Diagnosis not present

## 2020-04-18 DIAGNOSIS — N854 Malposition of uterus: Secondary | ICD-10-CM

## 2020-04-18 NOTE — Progress Notes (Signed)
    Kathryn Sexton [birth year] [id number]        49 y.o.  G1P1L1  RP:  Menorrhagia/Fibroids for Pelvic US  HPI:  No change x 01/2020: Menstrual periods every month with heavier flow. Known large uterine fibroids. Previous Robotic Myomectomies. Pelvic pressure worsening and feeling of being bloated. Normal vaginal secretions. Using condoms for contraception. Urine and bowel movements normal.  Hb at 13.3 on 02/15/2020.   OB History  Gravida Para Term Preterm AB Living  1 1       1   SAB TAB Ectopic Multiple Live Births               # Outcome Date GA Lbr Len/2nd Weight Sex Delivery Anes PTL Lv  1 Para             Past medical history,surgical history, problem list, medications, allergies, family history and social history were all reviewed and documented in the EPIC chart.   Directed ROS with pertinent positives and negatives documented in the history of present illness/assessment and plan.  Exam:  There were no vitals filed for this visit. General appearance:  Normal  Pelvic US today: T/V images.  Anteverted uterus enlarged with multiple fibroids, the largest measuring 9.6 x 7 cm, increased in size since the ultrasound on May 28, 2019.  The overall uterine size is measured at 14.2 x 9.01 x 7.85 cm.  The endometrial lining was not well seen due to overlying fibroids.  Both ovaries were difficult to visualize due to displacement from fibroids and overlying bowels.  No adnexal mass seen.  No free fluid in the posterior cul-de-sac.   Assessment/Plan:  49 y.o. G1P1   1. 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  Severe dysmenorrhea.  Pelvic ultrasound findings reviewed with patient.  The overall uterine size is measured at 14.2 x 9.01 x 7.85 cm.  The largest fibroid has increased since last ultrasound August 2020 and is now 9.6 cm.  Decision to proceed with hysterectomy.  Will attempt an XI Robotic TLH/Bilateral Salpingectomy which  is scheduled on 04/23/2020.  Patient is aware that we may have to convert to a TAH if the uterus with fibroids are too large.  Preop preparation, surgery, risks and benefits as well as postop precautions and expectations reviewed with patient.  Patient voiced understanding and agreement with plan.  2. Fibroid Large uterine Fibroids.                        Patient was counseled as to the risk of surgery to include the following:  1. Infection (prohylactic antibiotics will be administered)  2. DVT/Pulmonary Embolism (prophylactic pneumo compression stockings will be used)  3.Trauma to internal organs requiring additional surgical procedure to repair any injury to internal organs requiring perhaps additional hospitalization days.  4.Hemmorhage requiring transfusion and blood products which carry risks such as anaphylactic reaction, hepatitis and AIDS  Patient had received literature information on the procedure scheduled and all her questions were answered and fully accepts all risk.   [name] MD, 10:59 AM 04/18/2020

## 2020-04-19 ENCOUNTER — Other Ambulatory Visit (HOSPITAL_COMMUNITY)
Admission: RE | Admit: 2020-04-19 | Discharge: 2020-04-19 | Disposition: A | Discharge status: Home / Self Care | Payer: No Typology Code available for payment source | Source: Ambulatory Visit | Attending: Obstetrics & Gynecology | Admitting: Obstetrics & Gynecology

## 2020-04-19 ENCOUNTER — Encounter (HOSPITAL_COMMUNITY)
Admission: RE | Admit: 2020-04-19 | Discharge: 2020-04-19 | Disposition: A | Discharge status: Home / Self Care | Payer: No Typology Code available for payment source | Source: Ambulatory Visit | Attending: Obstetrics & Gynecology | Admitting: Obstetrics & Gynecology

## 2020-04-19 ENCOUNTER — Encounter (HOSPITAL_COMMUNITY): Payer: Self-pay

## 2020-04-19 DIAGNOSIS — Z01818 Encounter for other preprocedural examination: Secondary | ICD-10-CM | POA: Insufficient documentation

## 2020-04-19 DIAGNOSIS — I10 Essential (primary) hypertension: Secondary | ICD-10-CM | POA: Insufficient documentation

## 2020-04-19 DIAGNOSIS — R9431 Abnormal electrocardiogram [ECG] [EKG]: Secondary | ICD-10-CM | POA: Insufficient documentation

## 2020-04-19 DIAGNOSIS — E119 Type 2 diabetes mellitus without complications: Secondary | ICD-10-CM | POA: Insufficient documentation

## 2020-04-19 DIAGNOSIS — Z79899 Other long term (current) drug therapy: Secondary | ICD-10-CM | POA: Insufficient documentation

## 2020-04-19 DIAGNOSIS — Z20822 Contact with and (suspected) exposure to covid-19: Secondary | ICD-10-CM | POA: Diagnosis not present

## 2020-04-19 DIAGNOSIS — D259 Leiomyoma of uterus, unspecified: Secondary | ICD-10-CM | POA: Insufficient documentation

## 2020-04-19 HISTORY — DX: Gastro-esophageal reflux disease without esophagitis: K21.9

## 2020-04-19 HISTORY — DX: Type 2 diabetes mellitus without complications: E11.9

## 2020-04-19 LAB — CBC
HCT: 42 % (ref 36.0–46.0)
Hemoglobin: 13.5 g/dL (ref 12.0–15.0)
MCH: 27.1 pg (ref 26.0–34.0)
MCHC: 32.1 g/dL (ref 30.0–36.0)
MCV: 84.3 fL (ref 80.0–100.0)
Platelets: 335 10*3/uL (ref 150–400)
RBC: 4.98 MIL/uL (ref 3.87–5.11)
RDW: 14.1 % (ref 11.5–15.5)
WBC: 10.5 10*3/uL (ref 4.0–10.5)
nRBC: 0 % (ref 0.0–0.2)

## 2020-04-19 LAB — SARS CORONAVIRUS 2 (TAT 6-24 HRS): SARS Coronavirus 2: NEGATIVE

## 2020-04-19 LAB — BASIC METABOLIC PANEL
Anion gap: 10 (ref 5–15)
BUN: 22 mg/dL — ABNORMAL HIGH (ref 6–20)
CO2: 27 mmol/L (ref 22–32)
Calcium: 8.7 mg/dL — ABNORMAL LOW (ref 8.9–10.3)
Chloride: 100 mmol/L (ref 98–111)
Creatinine, Ser: 0.68 mg/dL (ref 0.44–1.00)
GFR calc Af Amer: >60 mL/min (ref >60)
GFR calc non Af Amer: >60 mL/min (ref >60)
Glucose, Bld: 159 mg/dL — ABNORMAL HIGH (ref 70–99)
Potassium: 3 mmol/L — ABNORMAL LOW (ref 3.5–5.1)
Sodium: 137 mmol/L (ref 135–145)

## 2020-04-19 LAB — GLUCOSE, CAPILLARY: Glucose-Capillary: 171 mg/dL — ABNORMAL HIGH (ref 70–99)

## 2020-04-19 NOTE — Progress Notes (Signed)
Requested last office visit note from Dr. Versie Starks office.

## 2020-04-19 NOTE — Progress Notes (Addendum)
COVID Vaccine Completed: x2 Date COVID Vaccine completed: 01/19/20 COVID vaccine manufacturer: Pfizer     PCP - Dr. Andria Frames Cardiologist -  N/A  Chest x-ray - N/A EKG - 04-19-20 Stress Test - N/A ECHO - N/A Cardiac Cath - N/A  Sleep Study - N/A   Fasting Blood Sugar - 130-140s Checks Blood Sugar once or twice a week  Blood Thinner Instructions: N/A Aspirin Instructions:N/A Last Dose:N/A   Anesthesia review: Patient was interviewed by Konrad Felix, PA-C  Patient complains of SOB with climbing stairs and walking briskly.  Also complains of swelling of bilateral legs, left worse than right.  Denies  fever, cough.  C/o of some chest pain at PAT appointment   Patient verbalized understanding of instructions that were given to them at the PAT appointment. Patient was also instructed that they will need to review over the PAT instructions again at home before surgery.

## 2020-04-22 ENCOUNTER — Telehealth: Payer: Self-pay

## 2020-04-22 NOTE — Telephone Encounter (Addendum)
Patient called asking about bowel prep before surgery tomorrow. Evidently she had been ordered a bowel prep with previous robotic surgery with Dr. Marguerita Merles.  I told her I was unaware of the need for that. It was not ordered with pre op orders at [facility] and Dr. Marguerita Merles did not ask me to arrange that for her.  I see nothing in the pre op visit notes about need for a bowel prep. I explained ML off today but I will route this to her and let her know if I hear from her.

## 2020-04-22 NOTE — Progress Notes (Signed)
Anesthesia Chart Review   Case: [id number] Date/Time: 04/23/20 0815   Procedure: XI ROBOTIC ASSISTED TOTAL LAPAROSCOPIC HYSTERECTOMY AND SALPINGECTOMY (Bilateral ) - request 8:30am OR time in [place] block requests 2 1/2 hours   Anesthesia type: General   Pre-op diagnosis: uterus   Location: [place] OR ROOM .5 / [place]   Surgeons: [name], MD      DISCUSSION:49 y.o. never smoker with h/o HTN, DM II, GERD scheduled for above procedure 04/23/2020 with Dr. [name].    Pt reports she becomes short of breath when walking uphill and when going up more than 1 flight of stairs, denies chest pain.  She reports experiencing this for several years, unchanged.  EKG done at PAT visit with no changes from previous EKG.    Labs forwarded to PCP.  Will recheck BMP DOS.    VS: BP (!) 142/75    Pulse 92    Temp 36.7 C (Oral)    Resp 16    Ht 5\' 5"  (1.651 m)    Wt 80.3 kg    LMP 03/28/2020 (Exact Date)    SpO2 100%    BMI 29.45 kg/m   PROVIDERS: Koirala, [place], MD   LABS: Labs reviewed: Acceptable for surgery. (all labs ordered are listed, but only abnormal results are displayed)  Labs Reviewed  BASIC METABOLIC PANEL - Abnormal; Notable for the following components:      Result Value   Potassium 3.0 (*)    Glucose, Bld 159 (*)    BUN 22 (*)    Calcium 8.7 (*)    All other components within normal limits  GLUCOSE, CAPILLARY - Abnormal; Notable for the following components:   Glucose-Capillary 171 (*)    All other components within normal limits  CBC  TYPE AND SCREEN     IMAGES:   EKG: 04/19/2020 Rate 92 bpm Normal sinus rhythm low voltage precordial leads, unchanged from previous tracing Abnormal ECG  CV:  Past Medical History:  Diagnosis Date   Diabetes mellitus without complication (HCC)    Type II   GERD (gastroesophageal reflux disease)    Hypertension    states BP changes, no medication   Uterine fibroid     Past Surgical  History:  Procedure Laterality Date   BREAST BIOPSY Right 09/02/2018   Fibroadenoma   BREAST BIOPSY Right 09/02/2018   CSL   CESAREAN SECTION     ROBOT ASSISTED MYOMECTOMY N/A 01/09/2013   Procedure: ROBOTIC ASSISTED MYOMECTOMY;  Surgeon: [name], MD;  Location: [facility];  Service: Gynecology;  Laterality: N/A;    MEDICATIONS:  amLODipine (NORVASC) 5 MG tablet   atorvastatin (LIPITOR) 20 MG tablet   cholecalciferol (VITAMIN D) 1000 units tablet   empagliflozin (JARDIANCE) 25 MG TABS tablet   hydrochlorothiazide (HYDRODIURIL) 25 MG tablet   medroxyPROGESTERone (DEPO-PROVERA) 150 MG/ML injection   Omega-3 Fatty Acids (OMEGA-3 PO)   Potassium 99 MG TABS   Propylene Glycol (SYSTANE COMPLETE) 0.6 % SOLN   psyllium (REGULOID) 0.52 g capsule   RESTASIS 0.05 % ophthalmic emulsion   No current facility-administered medications for this encounter.     Konrad Felix, PA-C [facility] [phone]

## 2020-04-22 NOTE — Anesthesia Preprocedure Evaluation (Addendum)
Anesthesia Evaluation  Patient identified by MRN, date of birth, ID band Patient awake    Reviewed: Allergy & Precautions, NPO status , Patient's Chart, lab work & pertinent test results  History of Anesthesia Complications Negative for: history of anesthetic complications  Airway Mallampati: II  TM Distance: >3 FB Neck ROM: Full    Dental no notable dental hx. (+) Dental Advisory Given   Pulmonary neg pulmonary ROS,    Pulmonary exam normal        Cardiovascular hypertension, Pt. on medications Normal cardiovascular exam     Neuro/Psych negative neurological ROS     GI/Hepatic Neg liver ROS, GERD  ,  Endo/Other  diabetes  Renal/GU negative Renal ROS     Musculoskeletal negative musculoskeletal ROS (+)   Abdominal   Peds  Hematology negative hematology ROS (+)   Anesthesia Other Findings   Reproductive/Obstetrics                            Anesthesia Physical Anesthesia Plan  ASA: II  Anesthesia Plan: General   Post-op Pain Management:    Induction: Intravenous  PONV Risk Score and Plan: 4 or greater and Ondansetron, Dexamethasone, Midazolam and Scopolamine patch - Pre-op  Airway Management Planned: Oral ETT  Additional Equipment:   Intra-op Plan:   Post-operative Plan: Extubation in OR  Informed Consent: I have reviewed the patients History and Physical, chart, labs and discussed the procedure including the risks, benefits and alternatives for the proposed anesthesia with the patient or authorized representative who has indicated his/her understanding and acceptance.     Dental advisory given  Plan Discussed with: Anesthesiologist and CRNA  Anesthesia Plan Comments:        Anesthesia Quick Evaluation

## 2020-04-23 ENCOUNTER — Encounter (HOSPITAL_BASED_OUTPATIENT_CLINIC_OR_DEPARTMENT_OTHER): Payer: Self-pay | Admitting: Obstetrics & Gynecology

## 2020-04-23 ENCOUNTER — Encounter (HOSPITAL_BASED_OUTPATIENT_CLINIC_OR_DEPARTMENT_OTHER)
Admission: RE | Disposition: A | Discharge status: Home / Self Care | Payer: Self-pay | Source: Ambulatory Visit | Attending: Obstetrics & Gynecology

## 2020-04-23 ENCOUNTER — Ambulatory Visit (HOSPITAL_BASED_OUTPATIENT_CLINIC_OR_DEPARTMENT_OTHER): Payer: No Typology Code available for payment source | Admitting: Physician Assistant

## 2020-04-23 ENCOUNTER — Ambulatory Visit (HOSPITAL_BASED_OUTPATIENT_CLINIC_OR_DEPARTMENT_OTHER)
Admission: RE | Admit: 2020-04-23 | Discharge: 2020-04-23 | Disposition: A | Discharge status: Home / Self Care | Payer: No Typology Code available for payment source | Source: Ambulatory Visit | Attending: Obstetrics & Gynecology | Admitting: Obstetrics & Gynecology

## 2020-04-23 ENCOUNTER — Ambulatory Visit (HOSPITAL_BASED_OUTPATIENT_CLINIC_OR_DEPARTMENT_OTHER): Payer: No Typology Code available for payment source | Admitting: Anesthesiology

## 2020-04-23 ENCOUNTER — Other Ambulatory Visit: Payer: Self-pay

## 2020-04-23 DIAGNOSIS — Z8249 Family history of ischemic heart disease and other diseases of the circulatory system: Secondary | ICD-10-CM | POA: Insufficient documentation

## 2020-04-23 DIAGNOSIS — N800 Endometriosis of uterus: Secondary | ICD-10-CM

## 2020-04-23 DIAGNOSIS — N921 Excessive and frequent menstruation with irregular cycle: Secondary | ICD-10-CM | POA: Insufficient documentation

## 2020-04-23 DIAGNOSIS — K219 Gastro-esophageal reflux disease without esophagitis: Secondary | ICD-10-CM | POA: Diagnosis not present

## 2020-04-23 DIAGNOSIS — Z9889 Other specified postprocedural states: Secondary | ICD-10-CM

## 2020-04-23 DIAGNOSIS — Z888 Allergy status to other drugs, medicaments and biological substances status: Secondary | ICD-10-CM | POA: Diagnosis not present

## 2020-04-23 DIAGNOSIS — Z79899 Other long term (current) drug therapy: Secondary | ICD-10-CM | POA: Insufficient documentation

## 2020-04-23 DIAGNOSIS — N946 Dysmenorrhea, unspecified: Secondary | ICD-10-CM | POA: Diagnosis not present

## 2020-04-23 DIAGNOSIS — R102 Pelvic and perineal pain: Secondary | ICD-10-CM | POA: Diagnosis not present

## 2020-04-23 DIAGNOSIS — D259 Leiomyoma of uterus, unspecified: Secondary | ICD-10-CM | POA: Insufficient documentation

## 2020-04-23 DIAGNOSIS — I10 Essential (primary) hypertension: Secondary | ICD-10-CM | POA: Insufficient documentation

## 2020-04-23 DIAGNOSIS — Z793 Long term (current) use of hormonal contraceptives: Secondary | ICD-10-CM | POA: Diagnosis not present

## 2020-04-23 DIAGNOSIS — E119 Type 2 diabetes mellitus without complications: Secondary | ICD-10-CM | POA: Insufficient documentation

## 2020-04-23 DIAGNOSIS — N736 Female pelvic peritoneal adhesions (postinfective): Secondary | ICD-10-CM

## 2020-04-23 DIAGNOSIS — Z833 Family history of diabetes mellitus: Secondary | ICD-10-CM | POA: Insufficient documentation

## 2020-04-23 HISTORY — PX: ROBOTIC ASSISTED LAPAROSCOPIC HYSTERECTOMY AND SALPINGECTOMY: SHX6379

## 2020-04-23 HISTORY — PX: ROBOTIC ASSISTED LAPAROSCOPIC LYSIS OF ADHESION: SHX6080

## 2020-04-23 LAB — POCT I-STAT, CHEM 8
BUN: 15 mg/dL (ref 6–20)
BUN: 16 mg/dL (ref 6–20)
Calcium, Ion: 1.14 mmol/L — ABNORMAL LOW (ref 1.15–1.40)
Calcium, Ion: 1.26 mmol/L (ref 1.15–1.40)
Chloride: 100 mmol/L (ref 98–111)
Chloride: 98 mmol/L (ref 98–111)
Creatinine, Ser: 0.6 mg/dL (ref 0.44–1.00)
Creatinine, Ser: 0.6 mg/dL (ref 0.44–1.00)
Glucose, Bld: 153 mg/dL — ABNORMAL HIGH (ref 70–99)
Glucose, Bld: 154 mg/dL — ABNORMAL HIGH (ref 70–99)
HCT: 40 % (ref 36.0–46.0)
HCT: 41 % (ref 36.0–46.0)
Hemoglobin: 13.6 g/dL (ref 12.0–15.0)
Hemoglobin: 13.9 g/dL (ref 12.0–15.0)
Potassium: 2.6 mmol/L — CL (ref 3.5–5.1)
Potassium: 2.9 mmol/L — ABNORMAL LOW (ref 3.5–5.1)
Sodium: 140 mmol/L (ref 135–145)
Sodium: 142 mmol/L (ref 135–145)
TCO2: 25 mmol/L (ref 22–32)
TCO2: 27 mmol/L (ref 22–32)

## 2020-04-23 LAB — CBC
HCT: 32.9 % — ABNORMAL LOW (ref 36.0–46.0)
Hemoglobin: 10.6 g/dL — ABNORMAL LOW (ref 12.0–15.0)
MCH: 27 pg (ref 26.0–34.0)
MCHC: 32.2 g/dL (ref 30.0–36.0)
MCV: 83.9 fL (ref 80.0–100.0)
Platelets: 315 10*3/uL (ref 150–400)
RBC: 3.92 MIL/uL (ref 3.87–5.11)
RDW: 13.9 % (ref 11.5–15.5)
WBC: 14.4 10*3/uL — ABNORMAL HIGH (ref 4.0–10.5)
nRBC: 0 % (ref 0.0–0.2)

## 2020-04-23 LAB — BASIC METABOLIC PANEL
Anion gap: 12 (ref 5–15)
BUN: 17 mg/dL (ref 6–20)
CO2: 26 mmol/L (ref 22–32)
Calcium: 8.3 mg/dL — ABNORMAL LOW (ref 8.9–10.3)
Chloride: 102 mmol/L (ref 98–111)
Creatinine, Ser: 0.7 mg/dL (ref 0.44–1.00)
GFR calc Af Amer: >60 mL/min (ref >60)
GFR calc non Af Amer: >60 mL/min (ref >60)
Glucose, Bld: 211 mg/dL — ABNORMAL HIGH (ref 70–99)
Potassium: 3.3 mmol/L — ABNORMAL LOW (ref 3.5–5.1)
Sodium: 140 mmol/L (ref 135–145)

## 2020-04-23 LAB — POCT PREGNANCY, URINE: Preg Test, Ur: NEGATIVE

## 2020-04-23 LAB — TYPE AND SCREEN
ABO/RH(D): B POS
Antibody Screen: NEGATIVE

## 2020-04-23 LAB — GLUCOSE, CAPILLARY: Glucose-Capillary: 198 mg/dL — ABNORMAL HIGH (ref 70–99)

## 2020-04-23 SURGERY — XI ROBOTIC ASSISTED LAPAROSCOPIC HYSTERECTOMY AND SALPINGECTOMY
Anesthesia: General | Site: Abdomen | Laterality: Bilateral

## 2020-04-23 MED ORDER — OXYCODONE-ACETAMINOPHEN 5-325 MG PO TABS: 2.0000 | ORAL_TABLET | ORAL | Status: DC | PRN

## 2020-04-23 MED ORDER — PHENYLEPHRINE 40 MCG/ML (10ML) SYRINGE FOR IV PUSH (FOR BLOOD PRESSURE SUPPORT)
PREFILLED_SYRINGE | INTRAVENOUS | Status: AC
  Filled 2020-04-23: qty 10

## 2020-04-23 MED ORDER — PROMETHAZINE HCL 25 MG/ML IJ SOLN: 6.2500 mg | INTRAMUSCULAR | Status: DC | PRN

## 2020-04-23 MED ORDER — OXYCODONE-ACETAMINOPHEN 7.5-325 MG PO TABS: 1.0000 | ORAL_TABLET | Freq: Four times a day (QID) | ORAL | 0 refills | Status: DC | PRN

## 2020-04-23 MED ORDER — SUGAMMADEX SODIUM 200 MG/2ML IV SOLN
INTRAVENOUS | Status: DC | PRN
  Administered 2020-04-23: 170 mg via INTRAVENOUS

## 2020-04-23 MED ORDER — PHENYLEPHRINE HCL-NACL 10-0.9 MG/250ML-% IV SOLN
INTRAVENOUS | Status: DC | PRN
Start: 2020-04-23 — End: 2020-04-23
  Administered 2020-04-23: 50 ug/min via INTRAVENOUS

## 2020-04-23 MED ORDER — LACTATED RINGERS IV SOLN: INTRAVENOUS | Status: DC

## 2020-04-23 MED ORDER — ROCURONIUM BROMIDE 10 MG/ML (PF) SYRINGE
PREFILLED_SYRINGE | INTRAVENOUS | Status: DC | PRN
  Administered 2020-04-23: 70 mg via INTRAVENOUS
  Administered 2020-04-23 (×2): 20 mg via INTRAVENOUS

## 2020-04-23 MED ORDER — FENTANYL CITRATE (PF) 250 MCG/5ML IJ SOLN
INTRAMUSCULAR | Status: AC
  Filled 2020-04-23: qty 5

## 2020-04-23 MED ORDER — CELECOXIB 200 MG PO CAPS
ORAL_CAPSULE | ORAL | Status: AC
  Filled 2020-04-23: qty 1

## 2020-04-23 MED ORDER — LIDOCAINE 2% (20 MG/ML) 5 ML SYRINGE
INTRAMUSCULAR | Status: AC
  Filled 2020-04-23: qty 5

## 2020-04-23 MED ORDER — PHENYLEPHRINE HCL (PRESSORS) 10 MG/ML IV SOLN
INTRAVENOUS | Status: AC
  Filled 2020-04-23: qty 1

## 2020-04-23 MED ORDER — FENTANYL CITRATE (PF) 250 MCG/5ML IJ SOLN
INTRAMUSCULAR | Status: DC | PRN
  Administered 2020-04-23 (×3): 100 ug via INTRAVENOUS
  Administered 2020-04-23: 50 ug via INTRAVENOUS
  Administered 2020-04-23: 100 ug via INTRAVENOUS

## 2020-04-23 MED ORDER — PROPOFOL 10 MG/ML IV BOLUS
INTRAVENOUS | Status: DC | PRN
  Administered 2020-04-23: 150 mg via INTRAVENOUS
  Administered 2020-04-23: 50 mg via INTRAVENOUS

## 2020-04-23 MED ORDER — CEFAZOLIN SODIUM-DEXTROSE 2-4 GM/100ML-% IV SOLN
2.0000 g | INTRAVENOUS | Status: AC
  Administered 2020-04-23 (×2): 2 g via INTRAVENOUS

## 2020-04-23 MED ORDER — LIDOCAINE HCL (CARDIAC) PF 100 MG/5ML IV SOSY
PREFILLED_SYRINGE | INTRAVENOUS | Status: DC | PRN
  Administered 2020-04-23: 100 mg via INTRAVENOUS

## 2020-04-23 MED ORDER — HYDROMORPHONE HCL 2 MG/ML IJ SOLN
INTRAMUSCULAR | Status: AC
  Filled 2020-04-23: qty 1

## 2020-04-23 MED ORDER — PHENYLEPHRINE 40 MCG/ML (10ML) SYRINGE FOR IV PUSH (FOR BLOOD PRESSURE SUPPORT)
PREFILLED_SYRINGE | INTRAVENOUS | Status: DC | PRN
  Administered 2020-04-23: 120 ug via INTRAVENOUS
  Administered 2020-04-23: 80 ug via INTRAVENOUS
  Administered 2020-04-23: 200 ug via INTRAVENOUS
  Administered 2020-04-23: 80 ug via INTRAVENOUS
  Administered 2020-04-23: 120 ug via INTRAVENOUS
  Administered 2020-04-23 (×2): 80 ug via INTRAVENOUS
  Administered 2020-04-23: 120 ug via INTRAVENOUS
  Administered 2020-04-23: 80 ug via INTRAVENOUS

## 2020-04-23 MED ORDER — DEXAMETHASONE SODIUM PHOSPHATE 10 MG/ML IJ SOLN
INTRAMUSCULAR | Status: DC | PRN
  Administered 2020-04-23: 5 mg via INTRAVENOUS

## 2020-04-23 MED ORDER — OXYCODONE-ACETAMINOPHEN 5-325 MG PO TABS
ORAL_TABLET | ORAL | Status: AC
  Filled 2020-04-23: qty 1

## 2020-04-23 MED ORDER — DEXAMETHASONE SODIUM PHOSPHATE 10 MG/ML IJ SOLN
INTRAMUSCULAR | Status: AC
  Filled 2020-04-23: qty 1

## 2020-04-23 MED ORDER — ACETAMINOPHEN 325 MG PO TABS: 650.0000 mg | ORAL_TABLET | ORAL | Status: DC | PRN

## 2020-04-23 MED ORDER — HYDROMORPHONE HCL 1 MG/ML IJ SOLN
INTRAMUSCULAR | Status: DC | PRN
  Administered 2020-04-23: 1 mg via INTRAVENOUS

## 2020-04-23 MED ORDER — FENTANYL CITRATE (PF) 100 MCG/2ML IJ SOLN
INTRAMUSCULAR | Status: AC
  Filled 2020-04-23: qty 2

## 2020-04-23 MED ORDER — FENTANYL CITRATE (PF) 100 MCG/2ML IJ SOLN: 25.0000 ug | INTRAMUSCULAR | Status: DC | PRN

## 2020-04-23 MED ORDER — EPHEDRINE SULFATE-NACL 50-0.9 MG/10ML-% IV SOSY
PREFILLED_SYRINGE | INTRAVENOUS | Status: DC | PRN
  Administered 2020-04-23: 10 mg via INTRAVENOUS

## 2020-04-23 MED ORDER — PROPOFOL 10 MG/ML IV BOLUS
INTRAVENOUS | Status: AC
  Filled 2020-04-23: qty 40

## 2020-04-23 MED ORDER — SODIUM CHLORIDE 0.9 % IR SOLN
Status: DC | PRN
  Administered 2020-04-23: 3000 mL

## 2020-04-23 MED ORDER — ROCURONIUM BROMIDE 10 MG/ML (PF) SYRINGE
PREFILLED_SYRINGE | INTRAVENOUS | Status: AC
  Filled 2020-04-23: qty 10

## 2020-04-23 MED ORDER — ACETAMINOPHEN 500 MG PO TABS
1000.0000 mg | ORAL_TABLET | Freq: Once | ORAL | Status: AC
  Administered 2020-04-23: 1000 mg via ORAL

## 2020-04-23 MED ORDER — SCOPOLAMINE 1 MG/3DAYS TD PT72
1.0000 | MEDICATED_PATCH | TRANSDERMAL | Status: DC
  Administered 2020-04-23: 1.5 mg via TRANSDERMAL

## 2020-04-23 MED ORDER — CELECOXIB 200 MG PO CAPS
200.0000 mg | ORAL_CAPSULE | Freq: Once | ORAL | Status: AC
  Administered 2020-04-23: 200 mg via ORAL

## 2020-04-23 MED ORDER — SCOPOLAMINE 1 MG/3DAYS TD PT72
MEDICATED_PATCH | TRANSDERMAL | Status: AC
  Filled 2020-04-23: qty 1

## 2020-04-23 MED ORDER — ACETAMINOPHEN 500 MG PO TABS
ORAL_TABLET | ORAL | Status: AC
  Filled 2020-04-23: qty 2

## 2020-04-23 MED ORDER — HYDROCODONE-ACETAMINOPHEN 5-325 MG PO TABS
ORAL_TABLET | ORAL | Status: AC
  Filled 2020-04-23: qty 1

## 2020-04-23 MED ORDER — CEFAZOLIN SODIUM-DEXTROSE 2-4 GM/100ML-% IV SOLN
INTRAVENOUS | Status: AC
  Filled 2020-04-23: qty 100

## 2020-04-23 MED ORDER — MIDAZOLAM HCL 2 MG/2ML IJ SOLN
INTRAMUSCULAR | Status: AC
  Filled 2020-04-23: qty 2

## 2020-04-23 MED ORDER — ONDANSETRON HCL 4 MG/2ML IJ SOLN
INTRAMUSCULAR | Status: DC | PRN
Start: 2020-04-23 — End: 2020-04-23
  Administered 2020-04-23: 4 mg via INTRAVENOUS

## 2020-04-23 MED ORDER — HYDROCODONE-ACETAMINOPHEN 5-325 MG PO TABS
1.0000 | ORAL_TABLET | ORAL | Status: DC | PRN
  Administered 2020-04-23: 1 via ORAL

## 2020-04-23 MED ORDER — ONDANSETRON HCL 4 MG/2ML IJ SOLN
INTRAMUSCULAR | Status: AC
  Filled 2020-04-23: qty 2

## 2020-04-23 MED ORDER — BUPIVACAINE HCL (PF) 0.25 % IJ SOLN
INTRAMUSCULAR | Status: DC | PRN
  Administered 2020-04-23: 20 mL

## 2020-04-23 MED ORDER — MIDAZOLAM HCL 2 MG/2ML IJ SOLN
INTRAMUSCULAR | Status: DC | PRN
  Administered 2020-04-23: 2 mg via INTRAVENOUS

## 2020-04-23 SURGICAL SUPPLY — 72 items
ADH SKN CLS APL DERMABOND .7 (GAUZE/BANDAGES/DRESSINGS) ×2
BAG LAPAROSCOPIC 12 15 PORT 16 (BASKET) ×4 IMPLANT
BAG RETRIEVAL 12/15 (BASKET) ×6
BLADE SURG 10 STRL SS (BLADE) ×3 IMPLANT
BLADE SURG 11 STRL SS (BLADE) ×9 IMPLANT
CATH FOLEY 3WAY  5CC 16FR (CATHETERS) ×1
CATH FOLEY 3WAY 5CC 16FR (CATHETERS) ×2 IMPLANT
COVER BACK TABLE 60X90IN (DRAPES) ×3 IMPLANT
COVER SURGICAL LIGHT HANDLE (MISCELLANEOUS) ×3 IMPLANT
COVER TIP SHEARS 8 DVNC (MISCELLANEOUS) ×2 IMPLANT
COVER TIP SHEARS 8MM DA VINCI (MISCELLANEOUS) ×1
COVER WAND RF STERILE (DRAPES) ×3 IMPLANT
DEFOGGER SCOPE WARMER CLEARIFY (MISCELLANEOUS) ×3 IMPLANT
DERMABOND ADVANCED (GAUZE/BANDAGES/DRESSINGS) ×1
DERMABOND ADVANCED .7 DNX12 (GAUZE/BANDAGES/DRESSINGS) ×2 IMPLANT
DRAPE ARM DVNC X/XI (DISPOSABLE) ×8 IMPLANT
DRAPE COLUMN DVNC XI (DISPOSABLE) ×2 IMPLANT
DRAPE DA VINCI XI ARM (DISPOSABLE) ×4
DRAPE DA VINCI XI COLUMN (DISPOSABLE) ×1
DRAPE UTILITY XL STRL (DRAPES) ×3 IMPLANT
DURAPREP 26ML APPLICATOR (WOUND CARE) ×3 IMPLANT
ELECT REM PT RETURN 9FT ADLT (ELECTROSURGICAL) ×3
ELECTRODE REM PT RTRN 9FT ADLT (ELECTROSURGICAL) ×2 IMPLANT
GAUZE PETROLATUM 1 X8 (GAUZE/BANDAGES/DRESSINGS) ×3 IMPLANT
GAUZE VASELINE 3X9 (GAUZE/BANDAGES/DRESSINGS) ×3 IMPLANT
GLOVE BIO SURGEON STRL SZ 6.5 (GLOVE) ×9 IMPLANT
GLOVE BIO SURGEON STRL SZ7 (GLOVE) ×9 IMPLANT
GLOVE BIO SURGEON STRL SZ8 (GLOVE) ×9 IMPLANT
GLOVE BIOGEL PI IND STRL 6.5 (GLOVE) ×4 IMPLANT
GLOVE BIOGEL PI IND STRL 7.0 (GLOVE) ×14 IMPLANT
GLOVE BIOGEL PI IND STRL 7.5 (GLOVE) ×2 IMPLANT
GLOVE BIOGEL PI IND STRL 8 (GLOVE) IMPLANT
GLOVE BIOGEL PI INDICATOR 6.5 (GLOVE) ×2
GLOVE BIOGEL PI INDICATOR 7.0 (GLOVE) ×7
GLOVE BIOGEL PI INDICATOR 7.5 (GLOVE) ×1
GLOVE BIOGEL PI INDICATOR 8 (GLOVE)
GLOVE ECLIPSE 6.5 STRL STRAW (GLOVE) ×6 IMPLANT
GLOVE ECLIPSE 7.5 STRL STRAW (GLOVE) ×3 IMPLANT
GOWN STRL REUS W/TWL XL LVL3 (GOWN DISPOSABLE) ×3 IMPLANT
HOLDER FOLEY CATH W/STRAP (MISCELLANEOUS) ×3 IMPLANT
IRRIG SUCT STRYKERFLOW 2 WTIP (MISCELLANEOUS) ×3
IRRIGATION SUCT STRKRFLW 2 WTP (MISCELLANEOUS) ×2 IMPLANT
IV NS IRRIG 3000ML ARTHROMATIC (IV SOLUTION) ×3 IMPLANT
LEGGING LITHOTOMY PAIR STRL (DRAPES) ×3 IMPLANT
MANIFOLD NEPTUNE II (INSTRUMENTS) ×3 IMPLANT
OBTURATOR OPTICAL STANDARD 8MM (TROCAR) ×1
OBTURATOR OPTICAL STND 8 DVNC (TROCAR) ×2
OBTURATOR OPTICALSTD 8 DVNC (TROCAR) ×2 IMPLANT
OCCLUDER COLPOPNEUMO (BALLOONS) ×3 IMPLANT
PACK ROBOT WH (CUSTOM PROCEDURE TRAY) ×3 IMPLANT
PACK ROBOTIC GOWN (GOWN DISPOSABLE) ×3 IMPLANT
PACK TRENDGUARD 450 HYBRID PRO (MISCELLANEOUS) ×2 IMPLANT
PAD PREP 24X48 CUFFED NSTRL (MISCELLANEOUS) ×3 IMPLANT
PROTECTOR NERVE ULNAR (MISCELLANEOUS) ×6 IMPLANT
RETRACTOR WOUND ALXS 19CM XSML (INSTRUMENTS) ×2 IMPLANT
RTRCTR WOUND ALEXIS 19CM XSML (INSTRUMENTS) ×3
SEAL CANN UNIV 5-8 DVNC XI (MISCELLANEOUS) ×8 IMPLANT
SEAL XI 5MM-8MM UNIVERSAL (MISCELLANEOUS) ×4
SET IRRIG Y TYPE TUR BLADDER L (SET/KITS/TRAYS/PACK) ×3 IMPLANT
SET TRI-LUMEN FLTR TB AIRSEAL (TUBING) ×3 IMPLANT
SUT ETHIBOND 0 (SUTURE) IMPLANT
SUT VIC AB 4-0 PS2 27 (SUTURE) ×6 IMPLANT
SUT VICRYL 0 UR6 27IN ABS (SUTURE) ×3 IMPLANT
SUT VLOC 180 0 9IN  GS21 (SUTURE) ×1
SUT VLOC 180 0 9IN GS21 (SUTURE) ×2 IMPLANT
TIP RUMI ORANGE 6.7MMX12CM (TIP) ×3 IMPLANT
TIP UTERINE 6.7X10CM GRN DISP (MISCELLANEOUS) ×3 IMPLANT
TOWEL OR 17X26 10 PK STRL BLUE (TOWEL DISPOSABLE) ×3 IMPLANT
TRENDGUARD 450 HYBRID PRO PACK (MISCELLANEOUS) ×3
TROCAR BLADELESS OPT 12M 100M (ENDOMECHANICALS) ×3 IMPLANT
TROCAR PORT AIRSEAL 5X120 (TROCAR) ×3 IMPLANT
WATER STERILE IRR 1000ML POUR (IV SOLUTION) ×3 IMPLANT

## 2020-04-23 NOTE — Anesthesia Procedure Notes (Signed)
Procedure Name: Intubation Date/Time: 04/23/2020 8:40 AM Performed by: Raenette Rover, CRNA Pre-anesthesia Checklist: Patient identified, Emergency Drugs available, Suction available and Patient being monitored Patient Re-evaluated:Patient Re-evaluated prior to induction Oxygen Delivery Method: Circle system utilized Preoxygenation: Pre-oxygenation with 100% oxygen Induction Type: IV induction Ventilation: Mask ventilation without difficulty Laryngoscope Size: Mac and 3 Grade View: Grade I Tube type: Oral Tube size: 7.0 mm Number of attempts: 1 Airway Equipment and Method: Stylet Placement Confirmation: ETT inserted through vocal cords under direct vision,  positive ETCO2 and breath sounds checked- equal and bilateral Tube secured with: Tape Dental Injury: Teeth and Oropharynx as per pre-operative assessment

## 2020-04-23 NOTE — Progress Notes (Signed)
04/23/2020 1900 Dr. Dellis Filbert called and updated on BMP and CBC results. Verbal order received ok to d/c home despite Epic downtime. MD to update AVS when able. AVS printed and post-op instructions explained to patient from downtime forms. Last time pain medication administered and MD phone number to contact in case of concerns or questions written on the AVS and explained to patient. Pt. Verbalized understanding. Pt. Son at bedside during discharge teaching and also verbalized understanding. Pt. VSS, pain under control, pt. Ambulating in hallway, tolerating diet and able to void without difficulty. Only pt. Concern was left shoulder pain that was improving with ambulation and increased mobility, advised to make MD aware if shoulder pain does not improve. Pt. Verbalized understanding. D/c home per verbal order Dr. Dellis Filbert.  Nahla Lukin, Arville Lime

## 2020-04-23 NOTE — Transfer of Care (Signed)
Immediate Anesthesia Transfer of Care Note  Patient: Kathryn Sexton  Procedure(s) Performed: XI ROBOTIC ASSISTED TOTAL LAPAROSCOPIC HYSTERECTOMY AND SALPINGECTOMY (Bilateral Abdomen) XI ROBOTIC ASSISTED LAPAROSCOPIC EXTENSIVE LYSIS OF ADHESION (Abdomen)  Patient Location: PACU  Anesthesia Type:General  Level of Consciousness: drowsy  Airway & Oxygen Therapy: Patient Spontanous Breathing and Patient connected to nasal cannula oxygen  Post-op Assessment: Report given to RN and Post -op Vital signs reviewed and stable  Post vital signs: Reviewed and stable  Last Vitals:  Vitals Value Taken Time  BP 100/59 04/23/20 1333  Temp    Pulse 89 04/23/20 1338  Resp 14 04/23/20 1338  SpO2 95 % 04/23/20 1338  Vitals shown include unvalidated device data.  Last Pain:  Vitals:   04/23/20 0744  TempSrc: Oral      Patients Stated Pain Goal: 5 (54/98/26 4158)  Complications: No complications documented.

## 2020-04-23 NOTE — Discharge Summary (Signed)
Physician Discharge Summary  Patient ID: Kathryn Sexton MRN: [id number] DOB/AGE: 1971-02-01 49 y.o.  Admit date: 04/23/2020 Discharge date: 04/23/2020  Admission Diagnoses: Postoperative state [Z98.890]   Discharge Diagnoses: Same Active Problems:   Postoperative state   Discharged Condition: good  Consults:None  Significant Diagnostic Studies: labs: CBC Hb 10.6  Treatments:surgery: XI Robotic Total Laparoscopic Hysterectomy with Bilateral Salpingectomy, extensive lysis of Adhesions  Vitals:   04/23/20 1630 04/23/20 1945  BP: 111/72 (!) 103/61  Pulse: 89   Resp: 15 16  Temp: 97.7 F (36.5 C) 98.4 F (36.9 C)  SpO2: 100% 95%     No intake/output data recorded.   Hospital Course: Good  Discharge Exam: Normal post op per Nurse exam  Disposition: D/C Home   Allergies as of 04/23/2020      Reactions   Metformin Itching      Medication List    STOP taking these medications   medroxyPROGESTERone 150 MG/ML injection Commonly known as: DEPO-PROVERA     TAKE these medications   amLODipine 5 MG tablet Commonly known as: NORVASC Take 5 mg by mouth at bedtime.   atorvastatin 20 MG tablet Commonly known as: LIPITOR Take 20 mg by mouth at bedtime.   cholecalciferol 1000 units tablet Commonly known as: VITAMIN D Take 1,000 Units by mouth at bedtime.   hydrochlorothiazide 25 MG tablet Commonly known as: HYDRODIURIL Take 25 mg by mouth daily.   Jardiance 25 MG Tabs tablet Generic drug: empagliflozin Take 25 mg by mouth daily.   OMEGA-3 PO Take 2,400 mg by mouth daily.   oxyCODONE-acetaminophen 7.5-325 MG tablet Commonly known as: Percocet Take 1 tablet by mouth every 6 (six) hours as needed for severe pain.   Potassium 99 MG Tabs Take 99 mg by mouth daily.   psyllium 0.52 g capsule Commonly known as: REGULOID Take 0.52-1.04 g by mouth daily in the afternoon.   Restasis 0.05 % ophthalmic emulsion Generic drug: cycloSPORINE Place 1 drop into both  eyes 2 (two) times daily.   Systane Complete 0.6 % Soln Generic drug: Propylene Glycol Place 1 drop into both eyes 3 (three) times daily as needed (dry/irritated eyes.).         Follow-up Information    [name], MD Follow up in 3 week(s).   Specialty: Obstetrics and Gynecology Contact information: [address] [place] [address] [phone]                Signed: [name] 04/23/2020, 8:11 PM

## 2020-04-23 NOTE — Op Note (Addendum)
Operative Note  04/23/2020  1:29 PM  PATIENT:  Kathryn Sexton  49 y.o. female  PRE-OPERATIVE DIAGNOSIS:  Large uterine fibroids, Menometrorrhagia, Pelvic pain  POST-OPERATIVE DIAGNOSIS:  Large uterine fibroids, Menometrorrhagia, Pelvic pain, Extensive Adhesions  PROCEDURE:  Procedure(s): XI ROBOTIC ASSISTED TOTAL LAPAROSCOPIC HYSTERECTOMY AND BILATERAL SALPINGECTOMY XI ROBOTIC ASSISTED LAPAROSCOPIC EXTENSIVE LYSIS OF ADHESIONS  SURGEON:  Surgeon(s): [name], MD Ruggiero Pierini, MD  Primary Assistant needed throughout the case for retraction, irrigation and suction to improve visualization and maintain good hemostasis.  Surgery with a higher degree of difficulty because of very large Fibroids and severe adhesions.   ANESTHESIA:   general  FINDINGS: Large myomatous uterus, normal bilateral tubes and ovaries.  Severe omental adhesions with the uterus and uterine adhesions with the anterior left abdominopelvic wall.  Extensive lysis of adhesions increased the duration of the surgery by 2 hours.  DESCRIPTION OF OPERATION: Under general anesthesia with endotracheal intubation the patient is in lithotomy position.  She is prepped with DuraPrep on the abdomen and with Betadine on the suprapubic, vulvar and vaginal areas.  She is prepped as usual.  Timeout is done.  The Foley is inserted in the bladder.  The vaginal exam reveals an anteverted uterus enlarged with fibroids about 15 cm in diameter, mobile.  No adnexal mass.  The weighted speculum was inserted in the vagina and the anterior lip of the cervix is grasped with a tenaculum.  Dilation of the cervix with [facility] dilators.  The hysterometry is at 12 cm.  A #10 Rumi with the medium Koh ring are put in place.  The other instruments are removed.  We go to the abdomen.  The supraumbilical area is infiltrated with Marcaine one quarter plain.  A 1.5 cm incision is done at that level with the scalpel.  The aponeurosis is open with Mayo  scissors under direct vision after applying the cokers.  The parietal peritoneum is open bluntly with the fingers.  A pursestring stitch of Vicryl 0 is done on the aponeurosis.  The Sheryle Hail is inserted at that level.  The camera is inserted in the ports.  The abdominal wall is free of adhesions where the other ports will be inserted.  The uterus is enlarged and nodular with fibroids.  Both ovaries are normal in size and appearance.  Adhesions between the omentum and the uterus are present throughout.  Thick adhesions are present between the uterus and the anterior pelvis to the left all the way to the lower abdominal wall.  The camera was removed.  Infiltration of Marcaine one quarter plain at all port sites.  Small incisions with the scalpel at those levels.  Insertion of ports under direct vision with 2 robotic ports on the right in line with the umbilicus and 1 robotic ports on the distal left in line with the umbilicus.  The assistant port is inserted at the medial left also in line with the umbilicus.  The table was positioned in 30 degree Trendelenburg for robot docking.  The patient tolerates that position well.  The robot is docked from the right side of the patient.  Targeting is done.  The other arms are docked.  Robotic instruments are inserted under direct vision with the fenestrated clamp in the fourth arm, the scissors in the third arm and the bipolar in the first arm.  We go to the console.      We start with lysis of addition to release the omentum from the uterus.  Both ureters  are seen in normal anatomic position with good peristalsis.  We start on the right side with cauterization and section of the right mesosalpinx.  We then cauterized and section the right utero-ovarian ligament.  The right round ligament is cauterized and sectioned.  We start opening the visceral peritoneum anteriorly.  We then go to the left side.  We cauterized and section the left mesosalpinx.  We cauterized and section the  left utero-ovarian ligament.  We then have to proceed with extensive lysis of adhesions to free the uterus from the anterior and the left pelvic wall as well as the wall of the lower abdomen.  This is done meticulously and very close to the uterus to avoid trauma.  We then cauterized and sectioned the left round ligament.  The visceral peritoneum was opened anteriorly and the bladder was descended progressively beyond the [facility] ring.  The bladder was filled with fluid to localize it and avoid trauma.  The uterine arteries were cauterized and sectioned first on the right and then on the left side.  The vaginal occluder was inflated.  The upper aspect of the vagina was then opened with the tip of the scissors over the [facility] ring anteriorly then on the sides and posteriorly to completely detached the uterus with the cervix and both tubes.  The specimen was kept inside the abdomen as it was too large to pass vaginally.  The Rumi and Koh ring were removed.  The vaginal occluder was put back in place in the vagina.  Hemostasis is verified at all levels and was adequate.  Both ureters are seen with good peristalsis.  Urine is clear.  The instruments are changed to the cutting needle driver in the third arm and the long tip clamp in the first arm.  A V-Loc 0 at 9 inches is used to close the vaginal vault.  We started the right angle and do a running suture all the way to the left angle and then back.  The suture is cut and the needle is removed from the abdomen.  The robotic instruments are removed under direct vision.  The robot is undocked.  And we go to laparoscopy time.      We insert a 15 cm Endobag through the supraumbilical port the uterus with both tubes and cervix are inserted in the bag.  The specimen is morcellated with a knife into a cylinder inside the bag at the skin.  The specimen is sent to pathology.  We go back by laparoscopy to irrigate and suction.  We confirmed good hemostasis at all levels.  Good  peristalsis of the ureters bilaterally.  All laparoscopic instruments are removed.  The CO2 is evacuated.  The supraumbilical incision is closed by attaching the pursestring stitch at the aponeurosis.  All incisions are closed at the skin with a subcuticular suture of Vicryl 4-0.  Dermabond is added on all incisions.  The vaginal occluder is removed from the vagina.  The patient is brought to recovery room in good and stable status.  ESTIMATED BLOOD LOSS: 450 mL   Intake/Output Summary (Last 24 hours) at 04/23/2020 1329 Last data filed at 04/23/2020 1321 Gross per 24 hour  Intake 1000 ml  Output 1000 ml  Net 0 ml     BLOOD ADMINISTERED:none   LOCAL MEDICATIONS USED:  MARCAINE     SPECIMEN:  Source of Specimen:  Uterus with cervix and bilateral tubes  DISPOSITION OF SPECIMEN:  PATHOLOGY  COUNTS:  YES  PLAN  OF CARE: Transfer to PACU  Marie-Lyne LavoieMD1:29 PM

## 2020-04-23 NOTE — Anesthesia Postprocedure Evaluation (Signed)
Anesthesia Post Note  Patient: Kathryn Sexton  Procedure(s) Performed: XI ROBOTIC ASSISTED TOTAL LAPAROSCOPIC HYSTERECTOMY AND SALPINGECTOMY (Bilateral Abdomen) XI ROBOTIC ASSISTED LAPAROSCOPIC EXTENSIVE LYSIS OF ADHESION (Abdomen)     Patient location during evaluation: PACU Anesthesia Type: General Level of consciousness: awake and alert, oriented and patient cooperative Pain management: pain level controlled Vital Signs Assessment: post-procedure vital signs reviewed and stable Respiratory status: spontaneous breathing, nonlabored ventilation and respiratory function stable Cardiovascular status: blood pressure returned to baseline and stable Postop Assessment: no apparent nausea or vomiting Anesthetic complications: no   No complications documented.  Last Vitals:  Vitals:   04/23/20 1415 04/23/20 1430  BP: 119/76 114/78  Pulse: 88 97  Resp: 12 12  Temp:    SpO2: 99% 99%    Last Pain:  Vitals:   04/23/20 1415  TempSrc:   PainSc: 0-No pain                 Donyel Nester,E. [place]

## 2020-04-23 NOTE — H&P (Addendum)
Kathryn Sexton is an 49 y.o. female.G1P1L1  RP:  Menorrhagia/Fibroids for Robotic TLH/Bilateral Salpingectomy  HPI:  No change x 04/18/2020, started her menses today with mild/moderate flow: Menstrual periods every month withheavierflow. Known large uterine fibroids. Previous Robotic Myomectomies. Pelvic pressureworsening and feeling of being bloated.Normal vaginal secretions. Using condoms for contraception. Urine and bowel movements normal.  Hb at 13.3 on 02/15/2020.  Pertinent Gynecological History: Menses: Menometrorrhagia Contraception: condoms Blood transfusions: none Sexually transmitted diseases: None Previous GYN Procedures: Robotic Myomectomies Last mammogram: normal  Last pap: normal  OB History: G1, P1   Menstrual History:  Patient's last menstrual period was 04/23/2020.    Past Medical History:  Diagnosis Date  . Diabetes mellitus without complication ([place])    Type II  . GERD (gastroesophageal reflux disease)   . Hypertension    states BP changes, no medication  . Uterine fibroid     Past Surgical History:  Procedure Laterality Date  . BREAST BIOPSY Right 09/02/2018   Fibroadenoma  . BREAST BIOPSY Right 09/02/2018   CSL  . CESAREAN SECTION    . ROBOT ASSISTED MYOMECTOMY N/A 01/09/2013   Procedure: ROBOTIC ASSISTED MYOMECTOMY;  Surgeon: [name], MD;  Location: [facility];  Service: Gynecology;  Laterality: N/A;    Family History  Problem Relation Age of Onset  . Hypertension Mother   . Diabetes Mother   . Diabetes Maternal Uncle   . Stroke Neg Hx   . Cancer Neg Hx   . Heart disease Neg Hx   . Hyperlipidemia Neg Hx     Social History:  reports that she has never smoked. She has never used smokeless tobacco. She reports that she does not drink alcohol and does not use drugs.  Allergies:  Allergies  Allergen Reactions  . Metformin Itching    Medications Prior to Admission  Medication Sig Dispense Refill Last Dose  . amLODipine  (NORVASC) 5 MG tablet Take 5 mg by mouth at bedtime.    Past Week at 2330  . atorvastatin (LIPITOR) 20 MG tablet Take 20 mg by mouth at bedtime.    Past Week at 2330  . cholecalciferol (VITAMIN D) 1000 units tablet Take 1,000 Units by mouth at bedtime.    04/22/2020 at 1400  . empagliflozin (JARDIANCE) 25 MG TABS tablet Take 25 mg by mouth daily.   Past Week at 1400  . hydrochlorothiazide (HYDRODIURIL) 25 MG tablet Take 25 mg by mouth daily.   04/22/2020 at 1400  . Omega-3 Fatty Acids (OMEGA-3 PO) Take 2,400 mg by mouth daily.   04/22/2020 at 1400  . Potassium 99 MG TABS Take 99 mg by mouth daily.    04/22/2020 at 1400  . Propylene Glycol (SYSTANE COMPLETE) 0.6 % SOLN Place 1 drop into both eyes 3 (three) times daily as needed (dry/irritated eyes.).   04/22/2020 at 2330  . psyllium (REGULOID) 0.52 g capsule Take 0.52-1.04 g by mouth daily in the afternoon.   04/21/2020 at Unknown time  . RESTASIS 0.05 % ophthalmic emulsion Place 1 drop into both eyes 2 (two) times daily.    04/23/2020 at 0600  . medroxyPROGESTERone (DEPO-PROVERA) 150 MG/ML injection Inject 1 mL (150 mg total) into the muscle once for 1 dose. 1 mL 0     REVIEW OF SYSTEMS: A ROS was performed and pertinent positives and negatives are included in the history.  GENERAL: No fevers or chills. HEENT: No change in vision, no earache, sore throat or sinus congestion. NECK: No pain or stiffness.  CARDIOVASCULAR: No chest pain or pressure. No palpitations. PULMONARY: No shortness of breath, cough or wheeze. GASTROINTESTINAL: No abdominal pain, nausea, vomiting or diarrhea, melena or bright red blood per rectum. GENITOURINARY: No urinary frequency, urgency, hesitancy or dysuria. MUSCULOSKELETAL: No joint or muscle pain, no back pain, no recent trauma. DERMATOLOGIC: No rash, no itching, no lesions. ENDOCRINE: No polyuria, polydipsia, no heat or cold intolerance. No recent change in weight. HEMATOLOGICAL: No anemia or easy bruising or bleeding. NEUROLOGIC:  No headache, seizures, numbness, tingling or weakness. PSYCHIATRIC: No depression, no loss of interest in normal activity or change in sleep pattern.     Blood pressure (!) 140/83, pulse 86, temperature 98.3 F (36.8 C), temperature source Oral, resp. rate 19, height 5\' 5"  (1.651 m), weight 81.9 kg, last menstrual period 04/23/2020, SpO2 98 %.  Physical Exam:  See office notes   Results for orders placed or performed during the hospital encounter of 04/23/20 (from the past 24 hour(s))  Pregnancy, urine POC     Status: None   Collection Time: 04/23/20  7:08 AM  Result Value Ref Range   Preg Test, Ur NEGATIVE NEGATIVE   Covid Negative  Pelvic US today: T/V images.  Anteverted uterus enlarged with multiple fibroids, the largest measuring 9.6 x 7 cm, increased in size since the ultrasound on May 28, 2019.  The overall uterine size is measured at 14.2 x 9.01 x 7.85 cm.  The endometrial lining was not well seen due to overlying fibroids.  Both ovaries were difficult to visualize due to displacement from fibroids and overlying bowels.  No adnexal mass seen.  No free fluid in the posterior cul-de-sac.   Assessment/Plan:  49 y.o. G1P1   1. 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  Severe dysmenorrhea.  Pelvic ultrasound findings reviewed with patient.  The overall uterine size is measured at 14.2 x 9.01 x 7.85 cm.  The largest fibroid has increased since last ultrasound August 2020 and is now 9.6 cm.  Decision to proceed with hysterectomy.  Will attempt an XI Robotic TLH/Bilateral Salpingectomy which is scheduled on 04/23/2020.  Patient is aware that we may have to convert to a TAH if the uterus with fibroids are too large.  Preop preparation, surgery, risks and benefits as well as postop precautions and expectations reviewed with patient.  Patient voiced understanding and agreement with plan.  2. Fibroid Large uterine Fibroids.                         Patient was counseled as to the risk of surgery to include the following:  1. Infection (prohylactic antibiotics will be administered)  2. DVT/Pulmonary Embolism (prophylactic pneumo compression stockings will be used)  3.Trauma to internal organs requiring additional surgical procedure to repair any injury to internal organs requiring perhaps additional hospitalization days.  4.Hemmorhage requiring transfusion and blood products which carry risks such as anaphylactic reaction, hepatitis and AIDS  Patient had received literature information on the procedure scheduled and all her questions were answered and fully accepts all risk.  Marie-Lyne Ryah Cribb 04/23/2020, 8:12 AM

## 2020-04-24 ENCOUNTER — Encounter (HOSPITAL_BASED_OUTPATIENT_CLINIC_OR_DEPARTMENT_OTHER): Payer: Self-pay | Admitting: Obstetrics & Gynecology

## 2020-04-24 LAB — SURGICAL PATHOLOGY

## 2020-04-26 ENCOUNTER — Telehealth: Payer: Self-pay

## 2020-04-26 NOTE — Telephone Encounter (Signed)
Spoke with patient and informed her. °

## 2020-04-26 NOTE — Telephone Encounter (Signed)
Patient is post surgery 04/23/20. Had a couple of questions:  1. She asked is it okay to resume her regular medications now.  Vitamin D, Atorvastatin, Jardiance, etc.  2.  Daily showers okay now?

## 2020-04-26 NOTE — Telephone Encounter (Signed)
Yes to both questions.

## 2020-04-29 ENCOUNTER — Encounter: Payer: Self-pay | Admitting: Anesthesiology

## 2020-05-06 ENCOUNTER — Encounter: Payer: Self-pay | Admitting: Obstetrics & Gynecology

## 2020-05-06 ENCOUNTER — Other Ambulatory Visit: Payer: Self-pay

## 2020-05-06 ENCOUNTER — Ambulatory Visit (INDEPENDENT_AMBULATORY_CARE_PROVIDER_SITE_OTHER): Payer: No Typology Code available for payment source | Admitting: Obstetrics & Gynecology

## 2020-05-06 VITALS — BP 140/88

## 2020-05-06 DIAGNOSIS — T7840XA Allergy, unspecified, initial encounter: Secondary | ICD-10-CM | POA: Diagnosis not present

## 2020-05-06 DIAGNOSIS — B373 Candidiasis of vulva and vagina: Secondary | ICD-10-CM | POA: Diagnosis not present

## 2020-05-06 DIAGNOSIS — R3915 Urgency of urination: Secondary | ICD-10-CM

## 2020-05-06 MED ORDER — FLUCONAZOLE 150 MG PO TABS: 150.0000 mg | ORAL_TABLET | Freq: Once | ORAL | 3 refills | Status: AC

## 2020-05-06 MED FILL — FLUCONAZOLE 150 MG TABS: 150 | 1 days supply | Qty: 1 | Fill #0

## 2020-05-06 NOTE — Progress Notes (Signed)
    Kathryn Sexton [birth year] [id number]        49 y.o.  G1P1   RP: Redness/vesicles and irritation at all incisions worsening x a few days  HPI: XI Robotic TLH/Bilateral Salpingectomy 04/25/2020. Redness/vesicles and irritation at all incisions worsening x a few days.  No drainage.  No fever.  Urine with itching/irritation.  No vaginal d/c or bleeding.  No pelvic pain.  BMs normal.   OB History  Gravida Para Term Preterm AB Living  1 1       1   SAB TAB Ectopic Multiple Live Births               # Outcome Date GA Lbr Len/2nd Weight Sex Delivery Anes PTL Lv  1 Para             Past medical history,surgical history, problem list, medications, allergies, family history and social history were all reviewed and documented in the EPIC chart.   Directed ROS with pertinent positives and negatives documented in the history of present illness/assessment and plan.  Exam:  Vitals:   05/06/20 1419  BP: 140/88   General appearance:  Normal  Abdomen: All incisions with erythema and vesicles exactly at the site of Dermabond.  No induration, no pus, no skin dehiscence.  Abdomen soft, NT, not distended.  Gynecologic exam: Deferred.  U/A Normal except for a few Yeasts present   Assessment/Plan:  49 y.o. G1P1   1. Allergic reaction, initial encounter Allergic skin reaction to the Dermabond glue.  No sign of infection.  Glue removed as much as possible.  Patient will remove completely using Vaseline at home.  May use hydrocortisone 1% cream to reduce inflammation.  Benadryl as needed.  2. Urgency of urination Normal urine analysis except for yeasts present.  Will treat with fluconazole 1 tablet per mouth x1 dose.  Usage reviewed and prescription sent to pharmacy. - Urinalysis,Complete w/RFL Culture  Other orders - fluconazole (DIFLUCAN) 150 MG tablet; Take 1 tablet (150 mg total) by mouth once for 1 dose.  [name] MD, 2:26 PM 05/06/2020

## 2020-05-07 ENCOUNTER — Telehealth: Payer: Self-pay

## 2020-05-07 LAB — CULTURE INDICATED

## 2020-05-07 LAB — URINALYSIS, COMPLETE W/RFL CULTURE
Bacteria, UA: NONE SEEN /HPF
Bilirubin Urine: NEGATIVE
Hyaline Cast: NONE SEEN /LPF
Ketones, ur: NEGATIVE
Leukocyte Esterase: NEGATIVE
Nitrites, Initial: NEGATIVE
Protein, ur: NEGATIVE
RBC / HPF: NONE SEEN /HPF (ref 0–2)
Specific Gravity, Urine: 1.01 (ref 1.001–1.03)
pH: 6.5 (ref 5.0–8.0)

## 2020-05-07 LAB — URINE CULTURE
MICRO NUMBER:: 10802731
Result:: NO GROWTH
SPECIMEN QUALITY:: ADEQUATE

## 2020-05-07 NOTE — Telephone Encounter (Signed)
Post op. States she had visit yesterday. She said she had allergy to glue around incisions. She said she did go home and do all the things you instructed. Cleaned area with hydrogen peroxide, applied vaseline and Neosporin.  Still very uncomfortable with itching and burning in same area and asked if you can prescribe something to help.

## 2020-05-07 NOTE — Telephone Encounter (Signed)
Needs to reapply Vaseline only, then with a 4 x 4 sponge or cloth, needs to wipe off completely.  Then clean softly with a soapy wet cloth, rinse with water and pad dry.  Hydroxycodone cream 1% on all incisions and Benadryl x 48 hours.

## 2020-05-07 NOTE — Telephone Encounter (Signed)
Pt informed and understood all instructions. KW CMA

## 2020-05-09 ENCOUNTER — Telehealth: Payer: Self-pay

## 2020-05-09 NOTE — Telephone Encounter (Signed)
Patient said she was report today if she was not better. She has been using hydrocortisone cream and Benadry and still with itching around incison and blisters.

## 2020-05-09 NOTE — Telephone Encounter (Signed)
Patient advised.

## 2020-05-09 NOTE — Telephone Encounter (Signed)
Please tell her to continue to clean to make sure the glue is completely gone.  It will take 2 weeks before it is completely healed.  Will reassess at scheduled postop visit.

## 2020-05-11 ENCOUNTER — Encounter: Payer: Self-pay | Admitting: Obstetrics & Gynecology

## 2020-05-14 ENCOUNTER — Ambulatory Visit (INDEPENDENT_AMBULATORY_CARE_PROVIDER_SITE_OTHER): Payer: No Typology Code available for payment source | Admitting: Obstetrics & Gynecology

## 2020-05-14 ENCOUNTER — Other Ambulatory Visit: Payer: Self-pay

## 2020-05-14 ENCOUNTER — Encounter: Payer: Self-pay | Admitting: Obstetrics & Gynecology

## 2020-05-14 VITALS — BP 126/82

## 2020-05-14 DIAGNOSIS — Z09 Encounter for follow-up examination after completed treatment for conditions other than malignant neoplasm: Secondary | ICD-10-CM

## 2020-05-14 DIAGNOSIS — T7840XD Allergy, unspecified, subsequent encounter: Secondary | ICD-10-CM

## 2020-05-14 NOTE — Progress Notes (Signed)
    Kathryn Sexton 12/26/1970 [id number]        49 y.o.  G1P1   RP: Post XI Robotic TLH/Bilateral Salpingectomy 04/25/2020   HPI: Improved allergic reaction to the glue at the incision sites.  Right abdominal wall resolved bruising.  Mild abdominopelvic discomfort.  No vaginal bleeding.  No vaginal d/c.  Urine/BMs normal.  No fever.   OB History  Gravida Para Term Preterm AB Living  1 1       1   SAB TAB Ectopic Multiple Live Births               # Outcome Date GA Lbr Len/2nd Weight Sex Delivery Anes PTL Lv  1 Para             Past medical history,surgical history, problem list, medications, allergies, family history and social history were all reviewed and documented in the EPIC chart.   Directed ROS with pertinent positives and negatives documented in the history of present illness/assessment and plan.  Exam:  Vitals:   05/14/20 1250  BP: 126/82   General appearance:  Normal  Abdomen: Resolved erythema/vesicles.  Incisions well closed.  Gynecologic exam: Vulva normal.  Speculum:  Vaginal vault well closed.  No blood.  Normal secretions.   Assessment/Plan:  49 y.o. G1P1   1. Status post gynecological surgery, follow-up exam Good postop healing.  No other complication than allergy to the Dermabond, now resolving.  Will wean Benadryl.  F/U in 3 weeks.  2. Allergic reaction, subsequent encounter Resolving.  Kathryn Bruins MD, 1:00 PM 05/14/2020

## 2020-05-21 ENCOUNTER — Encounter: Payer: Self-pay | Admitting: Anesthesiology

## 2020-05-27 MED FILL — JARDIANCE 25 MG TABLET: 25 | 90 days supply | Qty: 90 | Fill #0

## 2020-05-30 ENCOUNTER — Other Ambulatory Visit: Payer: Self-pay | Admitting: Obstetrics & Gynecology

## 2020-05-30 ENCOUNTER — Encounter: Payer: Self-pay | Admitting: Obstetrics & Gynecology

## 2020-05-30 ENCOUNTER — Ambulatory Visit (INDEPENDENT_AMBULATORY_CARE_PROVIDER_SITE_OTHER): Payer: No Typology Code available for payment source | Admitting: Obstetrics & Gynecology

## 2020-05-30 ENCOUNTER — Other Ambulatory Visit: Payer: Self-pay

## 2020-05-30 VITALS — BP 126/82

## 2020-05-30 DIAGNOSIS — Z09 Encounter for follow-up examination after completed treatment for conditions other than malignant neoplasm: Secondary | ICD-10-CM

## 2020-05-30 DIAGNOSIS — B373 Candidiasis of vulva and vagina: Secondary | ICD-10-CM | POA: Diagnosis not present

## 2020-05-30 DIAGNOSIS — R300 Dysuria: Secondary | ICD-10-CM

## 2020-05-30 DIAGNOSIS — B3731 Acute candidiasis of vulva and vagina: Secondary | ICD-10-CM

## 2020-05-30 MED ORDER — FLUCONAZOLE 150 MG PO TABS
150.0000 mg | ORAL_TABLET | Freq: Every day | ORAL | 2 refills | Status: DC
Start: 2020-05-30 — End: 2020-05-30

## 2020-05-30 MED FILL — FLUCONAZOLE 150 MG TABS: 150 | 3 days supply | Qty: 3 | Fill #0

## 2020-05-30 NOTE — Progress Notes (Signed)
    Kathryn Sexton 07-21-1971 [id number]        49 y.o.  G1P1L1  RP: Postop XI Robotic TLH/Bilateral Salpingectomy 04/25/2020  HPI: Pain with urination.  No frequency.  No blood in urine.  No fever.  Otherwise doing very well.  Itching at the site of the abdominal incisions has resolved.  No redness or discharge at those incisions.  No vaginal bleeding.  No abnormal vaginal discharge.  Bowel movements normal.   OB History  Gravida Para Term Preterm AB Living  1 1       1   SAB TAB Ectopic Multiple Live Births               # Outcome Date GA Lbr Len/2nd Weight Sex Delivery Anes PTL Lv  1 Para             Past medical history,surgical history, problem list, medications, allergies, family history and social history were all reviewed and documented in the EPIC chart.   Directed ROS with pertinent positives and negatives documented in the history of present illness/assessment and plan.  Exam:  Vitals:   05/30/20 1131  BP: 126/82   General appearance:  Normal  Abdomen: Incisions well closed.  No erythema.  Gynecologic exam: Vulva normal.  Speculum:  Vaginal vault well closed.  No bleeding or discharge.  U/A: Negative, except for Yeasts present.   Assessment/Plan:  49 y.o. G1P1   1. Status post gynecological surgery, follow-up exam Good postop evolution with resolution of allergic reaction due to the glue at the incision sites.  No other complication.  Vaginal vault closing well.  Return to work 06/17/2020.  Precautions discussed.  F/U 3 weeks.  2. Dysuria U/A normal except for Yeasts.  Will treat with fluconazole. - Urinalysis,Complete w/RFL Culture  3. Yeast vaginitis Yeasts in urine and vaginal d/c c/w yeast vaginitis.  Counseling done.  Will treat with fluconazole.  Usage reviewed and prescription sent to pharmacy.  Other orders - fluconazole (DIFLUCAN) 150 MG tablet; Take 1 tablet (150 mg total) by mouth daily for 3 days.  [name] MD, 11:43 AM  05/30/2020

## 2020-06-01 LAB — URINALYSIS, COMPLETE W/RFL CULTURE
Bacteria, UA: NONE SEEN /HPF
Bilirubin Urine: NEGATIVE
Hyaline Cast: NONE SEEN /LPF
Ketones, ur: NEGATIVE
Leukocyte Esterase: NEGATIVE
Nitrites, Initial: NEGATIVE
Protein, ur: NEGATIVE
RBC / HPF: NONE SEEN /HPF (ref 0–2)
Specific Gravity, Urine: 1.015 (ref 1.001–1.03)
pH: 7 (ref 5.0–8.0)

## 2020-06-01 LAB — URINE CULTURE
MICRO NUMBER:: 10904932
SPECIMEN QUALITY:: ADEQUATE

## 2020-06-01 LAB — CULTURE INDICATED

## 2020-06-02 ENCOUNTER — Encounter: Payer: Self-pay | Admitting: Obstetrics & Gynecology

## 2020-06-04 ENCOUNTER — Ambulatory Visit: Payer: No Typology Code available for payment source | Admitting: Obstetrics & Gynecology

## 2020-06-05 ENCOUNTER — Other Ambulatory Visit: Payer: Self-pay

## 2020-06-05 ENCOUNTER — Telehealth: Payer: Self-pay

## 2020-06-05 ENCOUNTER — Encounter: Payer: Self-pay | Admitting: Obstetrics & Gynecology

## 2020-06-05 ENCOUNTER — Ambulatory Visit (INDEPENDENT_AMBULATORY_CARE_PROVIDER_SITE_OTHER): Payer: No Typology Code available for payment source | Admitting: Obstetrics & Gynecology

## 2020-06-05 VITALS — BP 126/80

## 2020-06-05 DIAGNOSIS — R109 Unspecified abdominal pain: Secondary | ICD-10-CM

## 2020-06-05 DIAGNOSIS — K5900 Constipation, unspecified: Secondary | ICD-10-CM

## 2020-06-05 NOTE — Progress Notes (Signed)
    Kathryn Sexton Jan 27, 1971 [id number]        49 y.o.  G1P1L1 Married   RP: Pelvic discomfort with urination and constipation  HPI: Pelvic discomfort at the end of urination.  No burning with miction, no frequency, no blood in urine.  C/O having difficulty passing her stools, come in small amount every 1-2 days.  No blood in stools.  No vaginal bleeding, normal vaginal secretions.  No fever.   OB History  Gravida Para Term Preterm AB Living  1 1       1   SAB TAB Ectopic Multiple Live Births               # Outcome Date GA Lbr Len/2nd Weight Sex Delivery Anes PTL Lv  1 Para             Past medical history,surgical history, problem list, medications, allergies, family history and social history were all reviewed and documented in the EPIC chart.   Directed ROS with pertinent positives and negatives documented in the history of present illness/assessment and plan.  Exam:  Vitals:   06/05/20 1517  BP: 126/80   General appearance:  Normal  Abdomen: Incisions well closed.  No distention, soft, NT.  Gynecologic exam: Vulva normal  Rectal exam:  Stools soft, not impacted.   U/A: Yellow clear, protein negative, nitrites negative, white blood cells 0-5, red blood cells 0-2, bacteria few, yeast few.  Urine culture pending.   Assessment/Plan:  49 y.o. G1P1L1   1. Abdominal discomfort U/A wnl.  Will wait on Urine Culture to decide if treatment is needed. - Urinalysis,Complete w/RFL Culture  2. Difficulty passing stool Soft stools which are not as formed as her usual stools.  Recommend resuming normal eating habits.  Reassured.  Other orders - Urinalysis,Complete w/RFL Culture  Kathryn Bruins MD, 3:39 PM 06/05/2020

## 2020-06-05 NOTE — Telephone Encounter (Signed)
Patient called in voice mail with several post op issues, one being burning with urination and stated "she is very worried".  I had CLaudia call her and schedule her an appointment with Dr. Marguerita Merles today.

## 2020-06-07 LAB — URINALYSIS, COMPLETE W/RFL CULTURE
Bilirubin Urine: NEGATIVE
Hyaline Cast: NONE SEEN /LPF
Ketones, ur: NEGATIVE
Leukocyte Esterase: NEGATIVE
Nitrites, Initial: NEGATIVE
Protein, ur: NEGATIVE
Specific Gravity, Urine: 1.015 (ref 1.001–1.03)
pH: 6 (ref 5.0–8.0)

## 2020-06-07 LAB — URINE CULTURE
MICRO NUMBER:: 10923934
SPECIMEN QUALITY:: ADEQUATE

## 2020-06-07 LAB — CULTURE INDICATED

## 2020-06-10 ENCOUNTER — Encounter: Payer: Self-pay | Admitting: Obstetrics & Gynecology

## 2020-06-13 ENCOUNTER — Ambulatory Visit: Payer: No Typology Code available for payment source | Admitting: Obstetrics & Gynecology

## 2020-06-18 ENCOUNTER — Ambulatory Visit: Payer: No Typology Code available for payment source | Admitting: Obstetrics & Gynecology

## 2020-07-04 ENCOUNTER — Other Ambulatory Visit (HOSPITAL_COMMUNITY): Payer: Self-pay | Admitting: Family Medicine

## 2020-07-04 ENCOUNTER — Ambulatory Visit: Payer: No Typology Code available for payment source | Admitting: Obstetrics & Gynecology

## 2020-07-04 MED FILL — AMLODIPINE BESYLATE 5 MG TA: 5 | 90 days supply | Qty: 90 | Fill #0

## 2020-07-04 MED FILL — ATORVASTATIN CALCIUM 20 MG: 20 | 90 days supply | Qty: 90 | Fill #0

## 2020-07-04 MED FILL — HYDROCHLOROTHIAZIDE 25 MG T: 25 | 90 days supply | Qty: 90 | Fill #0

## 2020-07-15 ENCOUNTER — Encounter: Payer: Self-pay | Admitting: Obstetrics & Gynecology

## 2020-07-15 ENCOUNTER — Other Ambulatory Visit: Payer: Self-pay

## 2020-07-15 ENCOUNTER — Ambulatory Visit (INDEPENDENT_AMBULATORY_CARE_PROVIDER_SITE_OTHER): Payer: No Typology Code available for payment source | Admitting: Obstetrics & Gynecology

## 2020-07-15 VITALS — BP 126/84

## 2020-07-15 DIAGNOSIS — R309 Painful micturition, unspecified: Secondary | ICD-10-CM | POA: Diagnosis not present

## 2020-07-15 DIAGNOSIS — B3741 Candidal cystitis and urethritis: Secondary | ICD-10-CM

## 2020-07-15 MED ORDER — FLUCONAZOLE 150 MG PO TABS: 150.0000 mg | ORAL_TABLET | Freq: Every day | ORAL | 2 refills | Status: AC

## 2020-07-15 MED FILL — FLUCONAZOLE 150 MG TABS: 150 | 3 days supply | Qty: 3 | Fill #1

## 2020-07-15 NOTE — Progress Notes (Signed)
    Kathryn Sexton March 06, 1971 [id number]        49 y.o.  G1P1L1  RP:  Pelvic pressure when passing urine  HPI: Postop XI Robotic TLH/Bilateral Salpingectomy 04/25/2020.  Improving with less and less discomfort when physically active.  No pain when resting.  Pelvic pressure when passing urine.  No UTI Sx otherwise.  Treated for Yeast vaginitis recently, no itching currently.  BMs normal.  No fever.   OB History  Gravida Para Term Preterm AB Living  1 1       1   SAB TAB Ectopic Multiple Live Births               # Outcome Date GA Lbr Len/2nd Weight Sex Delivery Anes PTL Lv  1 Para             Past medical history,surgical history, problem list, medications, allergies, family history and social history were all reviewed and documented in the EPIC chart.   Directed ROS with pertinent positives and negatives documented in the history of present illness/assessment and plan.  Exam:  Vitals:   07/15/20 1007  BP: 126/84   General appearance:  Normal  Abdomen: Soft, NT, no mass felt.  Gynecologic exam: Vulva normal.  Bimanual exam:  Vagina normal, vaginal vault well closed.  No pelvic mass felt, NT.  No abnormal d/c, no blood.  U/A: Yellow cloudy, protein negative, nitrites negative, white blood cells 0-5, red blood cells negative, bacteria few, yeasts moderate.  Urine culture pending.   Assessment/Plan:  49 y.o. G1P1   1. Painful urination Urine analysis showing yeast cystitis.  Will treat with fluconazole 1 tablet per mouth daily for 3 days.  Usage reviewed and prescription sent to pharmacy. - Urinalysis,Complete w/RFL Culture  2. Yeast cystitis As above. - Urinalysis,Complete w/RFL Culture  Other orders - fluconazole (DIFLUCAN) 150 MG tablet; Take 1 tablet (150 mg total) by mouth daily for 3 days.  [name] MD, 10:15 AM 07/15/2020

## 2020-07-17 LAB — URINALYSIS, COMPLETE W/RFL CULTURE
Bilirubin Urine: NEGATIVE
Hyaline Cast: NONE SEEN /LPF
Ketones, ur: NEGATIVE
Leukocyte Esterase: NEGATIVE
Nitrites, Initial: NEGATIVE
Protein, ur: NEGATIVE
RBC / HPF: NONE SEEN /HPF (ref 0–2)
Specific Gravity, Urine: 1.015 (ref 1.001–1.03)
pH: 7 (ref 5.0–8.0)

## 2020-07-17 LAB — URINE CULTURE
MICRO NUMBER:: 11083899
SPECIMEN QUALITY:: ADEQUATE

## 2020-07-17 LAB — CULTURE INDICATED

## 2020-08-23 IMAGING — MG DIGITAL DIAGNOSTIC UNILATERAL RIGHT MAMMOGRAM WITH TOMO AND CAD
2 series · 2 of 6 positions shown · non-contrast
Comparison: 09/02/2018 and earlier

CLINICAL DATA: First six-month follow-up following
ultrasound-guided core biopsy of 2 masses in the RIGHT breast. The
mass in the 12 o'clock location which should represent a benign
fibroadenoma. The mass in the 11:30 o'clock location of the RIGHT
breast was shown represent a complex sclerosing lesion.The patient
met with Dr. Tanesha and discussed the recommendation of surgical
excision. However, the patient did not desire excision at that time
and agreed to a six-month follow-up.

EXAM:
DIGITAL DIAGNOSTIC RIGHT MAMMOGRAM WITH CAD AND TOMO
ULTRASOUND RIGHT BREAST

[R MLO synth-2D]
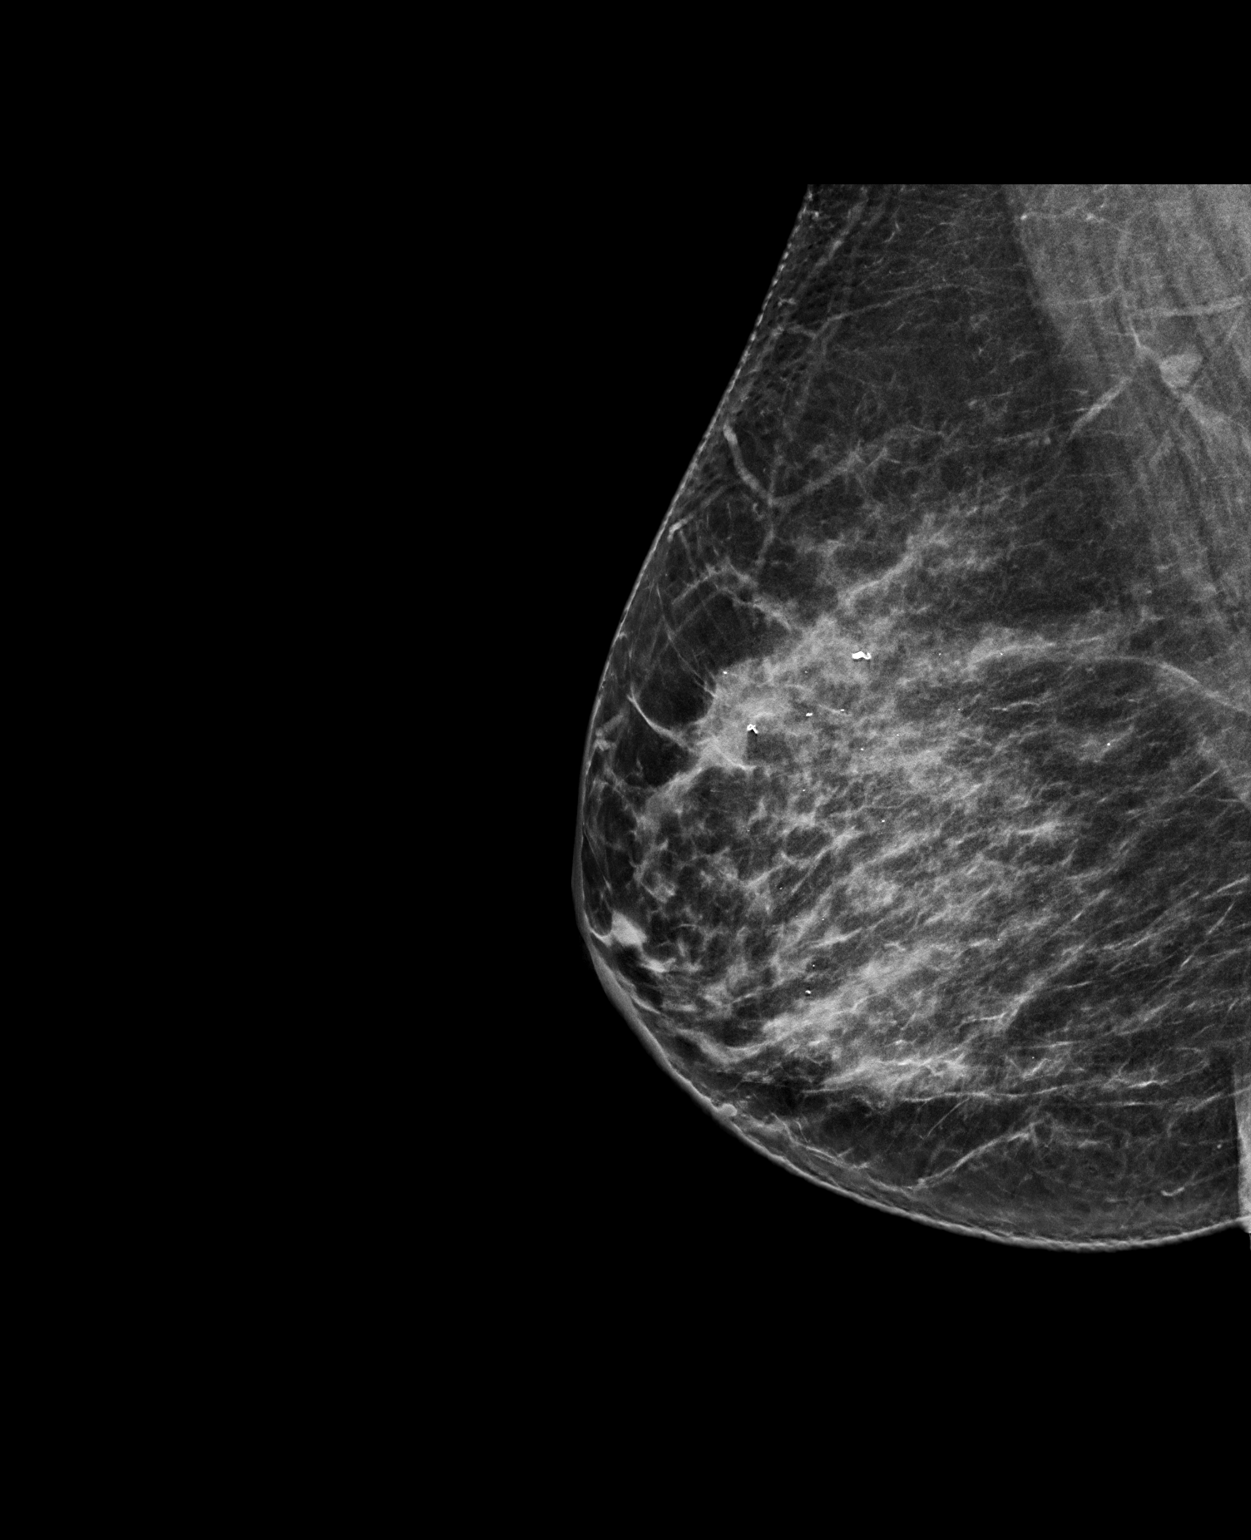

[R CC tomo · tomo slice 33/66.0]
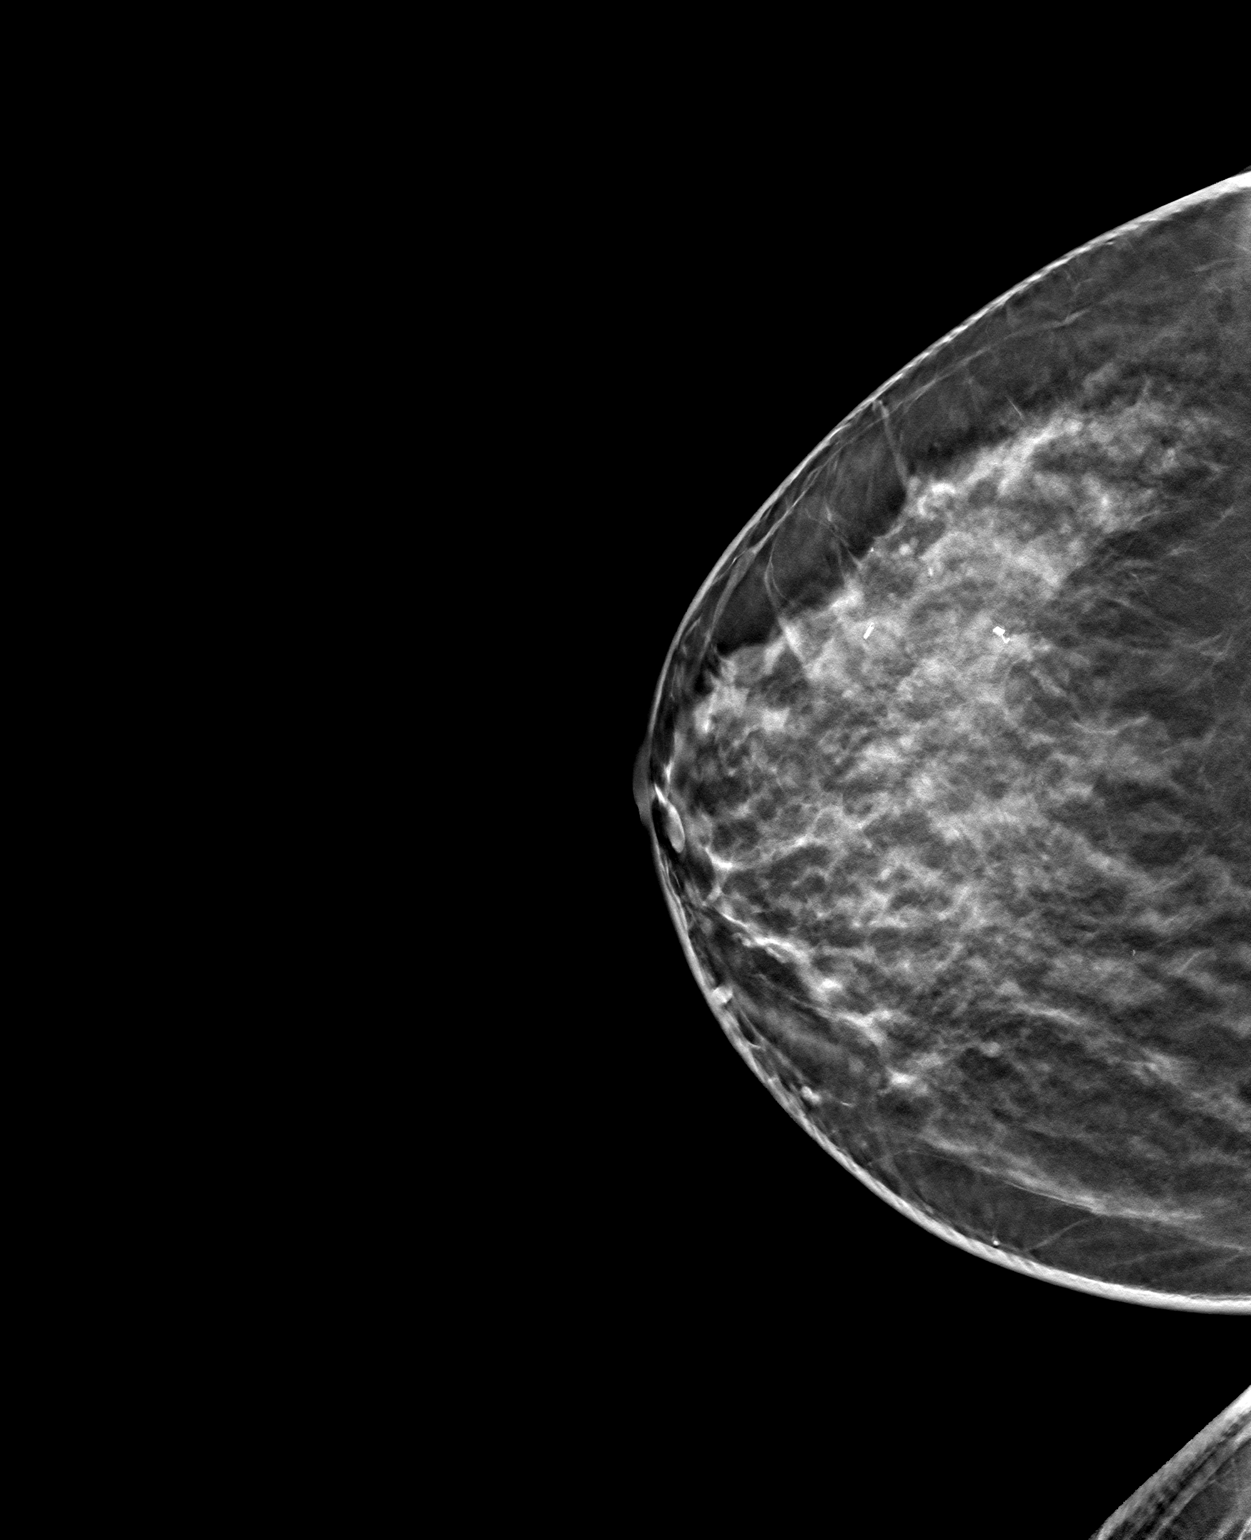

[2 of 6 positions shown; findings below may reference images not displayed]

ACR Breast Density Category c: The breast tissue is heterogeneously
dense, which may obscure small masses.
FINDINGS: Ribbon marker clips shaped identified and coil shaped tissue in the
UPPER portion of the RIGHT breast. No mass, distortion, or
suspicious microcalcifications are identified.

Mammographic images were processed with CAD.

Targeted ultrasound is performed, showing a circumscribed oval
hypoechoic parallel mass in the 11:30 o'clock location of the RIGHT
breast 3 centimeters from the nipple which measures 0.9 x 0.4 x
centimeters. A tissue marker clip is identified within the lesion.
Lesion appears subjectively smaller compared with previous study.
Previously mass measured 1.0 x 0.4 x 0.8 centimeters.
IMPRESSION: Stable to slightly smaller complex sclerosing lesion in the 11:30
o'clock location of the RIGHT breast.

Per recommendation of Dr. Tanesha, follow-up consultation was
recommended after this exam. I encouraged the patient to call Dr.
Tanesha for an appointment to discuss options and recommendations.

RECOMMENDATION:
1. Consultation with Dr. Tanesha.
2. Bilateral diagnostic mammogram is recommended in July 2019.

I have discussed the findings and recommendations with the patient.
Results were also provided in writing at the conclusion of the
visit. If applicable, a reminder letter will be sent to the patient
regarding the next appointment.

BI-RADS CATEGORY  3: Probably benign.

## 2020-08-27 ENCOUNTER — Other Ambulatory Visit: Payer: Self-pay

## 2020-08-27 ENCOUNTER — Ambulatory Visit
Admission: RE | Admit: 2020-08-27 | Discharge: 2020-08-27 | Disposition: A | Discharge status: Home / Self Care | Payer: No Typology Code available for payment source | Source: Ambulatory Visit | Attending: General Surgery | Admitting: General Surgery

## 2020-08-27 DIAGNOSIS — N63 Unspecified lump in unspecified breast: Secondary | ICD-10-CM

## 2020-09-10 ENCOUNTER — Other Ambulatory Visit (HOSPITAL_COMMUNITY): Payer: Self-pay | Admitting: Optometry

## 2020-09-10 MED FILL — FLUOROMETHOLONE 0.1% DROPS: 0.1 | 14 days supply | Qty: 5 | Fill #0

## 2020-09-10 MED FILL — RESTASIS 0.05% EYE EMULSION: 0.05 | 90 days supply | Qty: 180 | Fill #1

## 2020-09-14 MED FILL — JARDIANCE 25 MG TABLET: 25 | 90 days supply | Qty: 90 | Fill #1

## 2020-12-25 ENCOUNTER — Other Ambulatory Visit (HOSPITAL_COMMUNITY): Payer: Self-pay | Admitting: Family Medicine

## 2020-12-25 MED FILL — ATORVASTATIN CALCIUM 20 MG: 20 | 90 days supply | Qty: 90 | Fill #0

## 2020-12-25 MED FILL — HYDROCHLOROTHIAZIDE 25 MG T: 25 | 90 days supply | Qty: 90 | Fill #0

## 2020-12-25 MED FILL — AMLODIPINE BESYLATE 5 MG TA: 5 | 90 days supply | Qty: 90 | Fill #0

## 2020-12-25 MED FILL — JARDIANCE 25 MG TABLET: 25 | 90 days supply | Qty: 90 | Fill #0

## 2021-03-30 MED FILL — Amlodipine Besylate Tab 5 MG (Base Equivalent): ORAL | 90 days supply | Qty: 90 | Fill #0 | Status: AC

## 2021-03-30 MED FILL — Empagliflozin Tab 25 MG: ORAL | 90 days supply | Qty: 90 | Fill #0 | Status: AC

## 2021-03-30 MED FILL — Atorvastatin Calcium Tab 20 MG (Base Equivalent): ORAL | 90 days supply | Qty: 90 | Fill #0 | Status: AC

## 2021-03-30 MED FILL — Hydrochlorothiazide Tab 25 MG: ORAL | 90 days supply | Qty: 90 | Fill #0 | Status: AC

## 2021-03-31 ENCOUNTER — Other Ambulatory Visit (HOSPITAL_COMMUNITY): Payer: Self-pay

## 2021-04-01 ENCOUNTER — Other Ambulatory Visit (HOSPITAL_COMMUNITY): Payer: Self-pay

## 2021-04-02 ENCOUNTER — Other Ambulatory Visit (HOSPITAL_COMMUNITY): Payer: Self-pay

## 2021-04-03 ENCOUNTER — Other Ambulatory Visit (HOSPITAL_COMMUNITY): Payer: Self-pay

## 2021-04-07 ENCOUNTER — Other Ambulatory Visit (HOSPITAL_COMMUNITY): Payer: Self-pay

## 2021-04-08 ENCOUNTER — Other Ambulatory Visit (HOSPITAL_COMMUNITY): Payer: Self-pay

## 2021-07-03 ENCOUNTER — Other Ambulatory Visit (HOSPITAL_COMMUNITY): Payer: Self-pay

## 2021-07-03 MED FILL — Atorvastatin Calcium Tab 20 MG (Base Equivalent): ORAL | 90 days supply | Qty: 90 | Fill #1 | Status: AC

## 2021-07-03 MED FILL — Hydrochlorothiazide Tab 25 MG: ORAL | 90 days supply | Qty: 90 | Fill #1 | Status: AC

## 2021-07-03 MED FILL — Amlodipine Besylate Tab 5 MG (Base Equivalent): ORAL | 90 days supply | Qty: 90 | Fill #1 | Status: AC

## 2021-07-03 MED FILL — Empagliflozin Tab 25 MG: ORAL | 90 days supply | Qty: 90 | Fill #1 | Status: AC

## 2021-10-10 ENCOUNTER — Other Ambulatory Visit (HOSPITAL_COMMUNITY): Payer: Self-pay

## 2021-10-10 MED FILL — Empagliflozin Tab 25 MG: ORAL | 90 days supply | Qty: 90 | Fill #2 | Status: AC

## 2021-11-24 ENCOUNTER — Other Ambulatory Visit (HOSPITAL_COMMUNITY): Payer: Self-pay

## 2021-11-24 MED FILL — Hydrochlorothiazide Tab 25 MG: ORAL | 90 days supply | Qty: 90 | Fill #2 | Status: AC

## 2021-11-24 MED FILL — Amlodipine Besylate Tab 5 MG (Base Equivalent): ORAL | 90 days supply | Qty: 90 | Fill #0 | Status: AC

## 2021-11-24 MED FILL — Empagliflozin Tab 25 MG: ORAL | 90 days supply | Qty: 90 | Fill #0 | Status: CN

## 2021-11-24 MED FILL — Atorvastatin Calcium Tab 20 MG (Base Equivalent): ORAL | 90 days supply | Qty: 90 | Fill #2 | Status: AC

## 2021-12-10 ENCOUNTER — Other Ambulatory Visit (HOSPITAL_COMMUNITY): Payer: Self-pay

## 2021-12-10 MED ORDER — JARDIANCE 25 MG PO TABS
ORAL_TABLET | ORAL | 4 refills | Status: AC
  Filled 2022-01-16: qty 90, 90d supply, fill #0
  Filled 2022-04-21: qty 90, 90d supply, fill #1
  Filled 2022-07-31 – 2022-11-19 (×4): qty 90, 90d supply, fill #2

## 2021-12-10 MED ORDER — AMLODIPINE BESYLATE 5 MG PO TABS
ORAL_TABLET | ORAL | 4 refills | Status: DC
  Filled 2021-12-10 – 2022-03-02 (×3): qty 90, 90d supply, fill #0
  Filled 2022-07-31: qty 90, 90d supply, fill #1
  Filled 2022-11-11: qty 90, 90d supply, fill #2

## 2021-12-10 MED ORDER — HYDROCHLOROTHIAZIDE 25 MG PO TABS
ORAL_TABLET | ORAL | 4 refills | Status: DC
  Filled 2022-01-16 – 2022-03-02 (×2): qty 90, 90d supply, fill #0

## 2021-12-10 MED ORDER — ATORVASTATIN CALCIUM 20 MG PO TABS
ORAL_TABLET | ORAL | 4 refills | Status: DC
  Filled 2022-01-16 – 2022-03-02 (×2): qty 90, 90d supply, fill #0
  Filled 2022-07-31: qty 90, 90d supply, fill #1
  Filled 2022-11-11: qty 90, 90d supply, fill #2

## 2021-12-18 ENCOUNTER — Other Ambulatory Visit: Payer: Self-pay | Admitting: General Surgery

## 2021-12-18 DIAGNOSIS — Z1231 Encounter for screening mammogram for malignant neoplasm of breast: Secondary | ICD-10-CM

## 2021-12-22 ENCOUNTER — Ambulatory Visit
Admission: RE | Admit: 2021-12-22 | Discharge: 2021-12-22 | Disposition: A | Discharge status: Home / Self Care | Payer: No Typology Code available for payment source | Source: Ambulatory Visit | Attending: General Surgery | Admitting: General Surgery

## 2021-12-22 ENCOUNTER — Other Ambulatory Visit: Payer: Self-pay

## 2021-12-22 DIAGNOSIS — Z1231 Encounter for screening mammogram for malignant neoplasm of breast: Secondary | ICD-10-CM

## 2022-01-16 ENCOUNTER — Other Ambulatory Visit (HOSPITAL_COMMUNITY): Payer: Self-pay

## 2022-01-17 ENCOUNTER — Other Ambulatory Visit (HOSPITAL_COMMUNITY): Payer: Self-pay

## 2022-02-06 ENCOUNTER — Encounter: Payer: Self-pay | Admitting: Internal Medicine

## 2022-03-02 ENCOUNTER — Other Ambulatory Visit (HOSPITAL_COMMUNITY): Payer: Self-pay

## 2022-03-02 MED ORDER — FREESTYLE LITE TEST VI STRP
ORAL_STRIP | 3 refills | Status: AC
  Filled 2022-03-02 – 2022-07-31 (×2): qty 100, 90d supply, fill #0

## 2022-03-02 MED ORDER — FREESTYLE LITE W/DEVICE KIT
PACK | 0 refills | Status: DC
  Filled 2022-03-02: qty 1, 1d supply, fill #0

## 2022-03-02 MED ORDER — FREESTYLE LANCETS MISC
3 refills | Status: AC
  Filled 2022-03-02 – 2022-07-31 (×2): qty 100, 90d supply, fill #0

## 2022-03-03 ENCOUNTER — Other Ambulatory Visit (INDEPENDENT_AMBULATORY_CARE_PROVIDER_SITE_OTHER): Payer: No Typology Code available for payment source

## 2022-03-03 ENCOUNTER — Ambulatory Visit (INDEPENDENT_AMBULATORY_CARE_PROVIDER_SITE_OTHER): Payer: No Typology Code available for payment source | Admitting: Internal Medicine

## 2022-03-03 ENCOUNTER — Encounter: Payer: Self-pay | Admitting: Internal Medicine

## 2022-03-03 VITALS — BP 126/78 | HR 84 | Ht 64.0 in | Wt 169.2 lb

## 2022-03-03 DIAGNOSIS — K5900 Constipation, unspecified: Secondary | ICD-10-CM

## 2022-03-03 DIAGNOSIS — R1310 Dysphagia, unspecified: Secondary | ICD-10-CM | POA: Diagnosis not present

## 2022-03-03 DIAGNOSIS — Z1211 Encounter for screening for malignant neoplasm of colon: Secondary | ICD-10-CM

## 2022-03-03 DIAGNOSIS — D649 Anemia, unspecified: Secondary | ICD-10-CM

## 2022-03-03 DIAGNOSIS — R1032 Left lower quadrant pain: Secondary | ICD-10-CM

## 2022-03-03 LAB — CBC WITH DIFFERENTIAL/PLATELET
Basophils Absolute: 0.1 10*3/uL (ref 0.0–0.1)
Basophils Relative: 0.6 % (ref 0.0–3.0)
Eosinophils Absolute: 0.1 10*3/uL (ref 0.0–0.7)
Eosinophils Relative: 1.2 % (ref 0.0–5.0)
HCT: 43.3 % (ref 36.0–46.0)
Hemoglobin: 14.6 g/dL (ref 12.0–15.0)
Lymphocytes Relative: 27.2 % (ref 12.0–46.0)
Lymphs Abs: 2.8 10*3/uL (ref 0.7–4.0)
MCHC: 33.8 g/dL (ref 30.0–36.0)
MCV: 82.5 fl (ref 78.0–100.0)
Monocytes Absolute: 0.5 10*3/uL (ref 0.1–1.0)
Monocytes Relative: 5.2 % (ref 3.0–12.0)
Neutro Abs: 6.8 10*3/uL (ref 1.4–7.7)
Neutrophils Relative %: 65.8 % (ref 43.0–77.0)
Platelets: 303 10*3/uL (ref 150.0–400.0)
RBC: 5.24 Mil/uL — ABNORMAL HIGH (ref 3.87–5.11)
RDW: 13.2 % (ref 11.5–15.5)
WBC: 10.4 10*3/uL (ref 4.0–10.5)

## 2022-03-03 LAB — IBC + FERRITIN
Ferritin: 48.3 ng/mL (ref 10.0–291.0)
Iron: 61 ug/dL (ref 42–145)
Saturation Ratios: 12.8 % — ABNORMAL LOW (ref 20.0–50.0)
TIBC: 476 ug/dL — ABNORMAL HIGH (ref 250.0–450.0)
Transferrin: 340 mg/dL (ref 212.0–360.0)

## 2022-03-03 NOTE — Patient Instructions (Addendum)
If you are age 51 or older, your body mass index should be between 23-30. Your Body mass index is 29.05 kg/m. If this is out of the aforementioned range listed, please consider follow up with your Primary Care Provider.  If you are age 43 or younger, your body mass index should be between 19-25. Your Body mass index is 29.05 kg/m. If this is out of the aformentioned range listed, please consider follow up with your Primary Care Provider.   _______________________________________________________  The [facility] providers would like to encourage you to use [facility] to communicate with providers for non-urgent requests or questions.  Due to long hold times on the telephone, sending your provider a message by [facility] may be a faster and more efficient way to get a response.  Please allow 48 business hours for a response.  Please remember that this is for non-urgent requests.   Due to recent changes in healthcare laws, you may see the results of your imaging and laboratory studies on MyChart before your provider has had a chance to review them.  We understand that in some cases there may be results that are confusing or concerning to you. Not all laboratory results come back in the same time frame and the provider may be waiting for multiple results in order to interpret others.  Please give [place] 48 hours in order for your provider to thoroughly review all the results before contacting the office for clarification of your results.     You will be contacted by [facility] in the next 2 days to arrange a CT.  The number on your caller ID will be [phone], please answer when they call.  If you have not heard from them in 2 days please call [phone] to schedule.     You have been scheduled for an endoscopy and colonoscopy. Please follow the written instructions given to you at your visit today. Please pick up your prep supplies at the pharmacy within the next 1-3 days. If you use  inhalers (even only as needed), please bring them with you on the day of your procedure.   Start Miralax daily.  Your provider has requested that you go to the basement level for lab work before leaving today. Press "B" on the elevator. The lab is located at the first door on the left as you exit the elevator.    ______________________________________________________________________

## 2022-03-03 NOTE — Progress Notes (Signed)
Chief Complaint: Dysphagia, ab pain  HPI : 51 year old female with history of T2DM, GERD, HTN, uterine fibroids presents with dysphagia and abdominal pain  She has been dealing with dysphagia about 2-3 times per year. This dysphagia has been occurring for the last 1-2 years. This has not been getting worse over time. This dysphagia occurs to solids and liquids. The dysphagia occurs at the bottom of her throat. She has been told by her PCP that she has been noticing some swelling in her throat. She endorses some LLQ ab pain before having a BM. The pain gets better after she has a BM. The pain has been present for the last year. Denies N&V, chest burning, regurgitation, diarrhea, blood in the stools. She did have some constipation after her hysterectomy surgery, which occurs intermittently. She has lost about 10 lbs over the last year. She is planning to go to pharmacy school. She takes Metamucil powder, which seems to helping with her BMs. Denies fam hx of GI issues. Denies prior EGD or colonoscopy.  Wt Readings from Last 3 Encounters:  03/03/22 169 lb 4 oz (76.8 kg)  04/23/20 180 lb 9.6 oz (81.9 kg)  04/19/20 177 lb (80.3 kg)   Past Medical History:  Diagnosis Date   Diabetes mellitus without complication (HCC)    Type II   GERD (gastroesophageal reflux disease)    HLD (hyperlipidemia)    Hypertension    states BP changes, no medication   Uterine fibroid      Past Surgical History:  Procedure Laterality Date   BREAST BIOPSY Right 09/02/2018   Fibroadenoma   BREAST BIOPSY Right 09/02/2018   CSL   CESAREAN SECTION     ROBOT ASSISTED MYOMECTOMY N/A 01/09/2013   Procedure: ROBOTIC ASSISTED MYOMECTOMY;  Surgeon: [name], MD;  Location: [facility];  Service: Gynecology;  Laterality: N/A;   ROBOTIC ASSISTED LAPAROSCOPIC HYSTERECTOMY AND SALPINGECTOMY Bilateral 04/23/2020   Procedure: XI ROBOTIC ASSISTED TOTAL LAPAROSCOPIC HYSTERECTOMY AND SALPINGECTOMY;  Surgeon: [name],  MD;  Location: [facility];  Service: Gynecology;  Laterality: Bilateral;   ROBOTIC ASSISTED LAPAROSCOPIC LYSIS OF ADHESION  04/23/2020   Procedure: XI ROBOTIC ASSISTED LAPAROSCOPIC EXTENSIVE LYSIS OF ADHESION;  Surgeon: [name], MD;  Location: [facility];  Service: Gynecology;;   Family History  Problem Relation Age of Onset   Hypertension Mother    Diabetes Mother    Thyroid nodules Sister    Hyperlipidemia Brother    Hypertension Brother    Diabetes Brother    Hypotension Maternal Grandmother    Hypertension Paternal Grandmother    Diabetes Maternal Uncle    Kidney disease Maternal Uncle    Stroke Neg Hx    Cancer Neg Hx    Heart disease Neg Hx    Social History   Tobacco Use   Smoking status: Never   Smokeless tobacco: Never  Vaping Use   Vaping Use: Never used  Substance Use Topics   Alcohol use: No   Drug use: No   Current Outpatient Medications  Medication Sig Dispense Refill   amLODipine (NORVASC) 5 MG tablet TAKE 1 TABLET BY MOUTH ONCE DAILY. 90 tablet 4   atorvastatin (LIPITOR) 20 MG tablet Take 1 tablet by mouth once daily. 90 tablet 4   cholecalciferol (VITAMIN D) 1000 units tablet Take 1,000 Units by mouth at bedtime.      empagliflozin (JARDIANCE) 25 MG TABS tablet TAKE 1 TABLET BY MOUTH DAILY 90 tablet 4  glucose blood (FREESTYLE LITE) test strip Use as directed to check bl0od glucose once daily. 100 strip 3   hydrochlorothiazide (HYDRODIURIL) 25 MG tablet TAKE 1 TABLET BY MOUTH ONCE DAILY IN THE MORNING. 90 tablet 4   Lancets (FREESTYLE) lancets Use as directed to check blood glucose once daily. 100 each 3   METAMUCIL FIBER PO 2 tsp in liquid by mouth as needed     Multiple Vitamins-Minerals (CENTRUM PO) Take 1 tablet by mouth as needed.     Omega-3 Fatty Acids (OMEGA-3 PO) Take 2,400 mg by mouth daily.     Potassium 99 MG TABS Take 99 mg by mouth daily.      Propylene Glycol (SYSTANE COMPLETE) 0.6 % SOLN Place 1  drop into both eyes 3 (three) times daily as needed (dry/irritated eyes.).     RESTASIS 0.05 % ophthalmic emulsion Place 1 drop into both eyes 2 (two) times daily.      No current facility-administered medications for this visit.   Allergies  Allergen Reactions   Metformin Itching   Other     Surgical glue: Rash Itching     Review of Systems: All systems reviewed and negative except where noted in HPI.   Physical Exam: BP 126/78 (BP Location: Left Arm, Patient Position: Sitting, Cuff Size: Normal)   Pulse 84   Ht '5\' 4"'$  (1.626 m)   Wt 169 lb 4 oz (76.8 kg)   LMP 04/23/2020   BMI 29.05 kg/m  Constitutional: Pleasant,well-developed, female in no acute distress. HEENT: Normocephalic and atraumatic. Conjunctivae are normal. No scleral icterus. Cardiovascular: Normal rate, regular rhythm.  Pulmonary/chest: Effort normal and breath sounds normal. No wheezing, rales or rhonchi. Abdominal: Soft, nondistended, nontender. Bowel sounds active throughout. There are no masses palpable. No hepatomegaly. Extremities: No edema Neurological: Alert and oriented to person place and time. Skin: Skin is warm and dry. No rashes noted. Psychiatric: Normal mood and affect. Behavior is normal.  Labs 03/2020: CBC with elevated WBC 14.4 and low Hb of 10.6. BMP with low K of 3.3.  ASSESSMENT AND PLAN: Dysphagia LLQ ab pain Constipation Anemia Colon cancer screening Patient presents with dysphagia and left lower quadrant abdominal pain that has been present for the last year.  I will plan for further evaluation by performing a CT and EGD to look for etiologies for symptoms.  Patient does describe some underlying constipation issues so I encouraged her to try to stay hydrated and walk daily in order to help induce more regular bowel movements.  Will also plan for colonoscopy for colon cancer screening.  Patient has been noted to be anemic in the past we will check to see if she has iron deficiency  anemia. - Check CBC, ferritin/IBC - Encourage drinking 8 cups of water per day and walking 30 minutes per day - Start daily Miralax - CT A/P w/contrast  - EGD/colonoscopy [facility]  [name], MD

## 2022-03-10 ENCOUNTER — Other Ambulatory Visit (HOSPITAL_COMMUNITY): Payer: Self-pay

## 2022-03-11 ENCOUNTER — Other Ambulatory Visit (HOSPITAL_COMMUNITY): Payer: Self-pay

## 2022-03-11 MED ORDER — CYCLOSPORINE 0.05 % OP EMUL
OPHTHALMIC | 3 refills | Status: DC
  Filled 2022-03-11: qty 180, 90d supply, fill #0

## 2022-03-11 MED ORDER — CYCLOSPORINE 0.05 % OP EMUL
OPHTHALMIC | 3 refills | Status: AC
  Filled 2022-03-11: qty 5.5, 55d supply, fill #0

## 2022-03-12 ENCOUNTER — Other Ambulatory Visit (HOSPITAL_COMMUNITY): Payer: Self-pay

## 2022-03-13 ENCOUNTER — Other Ambulatory Visit (HOSPITAL_COMMUNITY): Payer: Self-pay

## 2022-03-16 ENCOUNTER — Other Ambulatory Visit (HOSPITAL_COMMUNITY): Payer: Self-pay

## 2022-03-17 ENCOUNTER — Other Ambulatory Visit (HOSPITAL_COMMUNITY): Payer: Self-pay

## 2022-03-23 ENCOUNTER — Encounter: Payer: Self-pay | Admitting: Internal Medicine

## 2022-03-23 ENCOUNTER — Ambulatory Visit (HOSPITAL_COMMUNITY)
Admission: RE | Admit: 2022-03-23 | Discharge: 2022-03-23 | Disposition: A | Discharge status: Home / Self Care | Payer: No Typology Code available for payment source | Source: Ambulatory Visit | Attending: Internal Medicine | Admitting: Internal Medicine

## 2022-03-23 DIAGNOSIS — Z1211 Encounter for screening for malignant neoplasm of colon: Secondary | ICD-10-CM | POA: Insufficient documentation

## 2022-03-23 DIAGNOSIS — R1032 Left lower quadrant pain: Secondary | ICD-10-CM | POA: Diagnosis present

## 2022-03-23 DIAGNOSIS — R1310 Dysphagia, unspecified: Secondary | ICD-10-CM | POA: Diagnosis present

## 2022-03-23 DIAGNOSIS — K5900 Constipation, unspecified: Secondary | ICD-10-CM | POA: Diagnosis present

## 2022-03-23 DIAGNOSIS — D649 Anemia, unspecified: Secondary | ICD-10-CM | POA: Insufficient documentation

## 2022-03-23 MED ORDER — IOHEXOL 300 MG/ML  SOLN
100.0000 mL | Freq: Once | INTRAMUSCULAR | Status: AC | PRN
  Administered 2022-03-23: 100 mL via INTRAVENOUS

## 2022-03-23 MED ORDER — SODIUM CHLORIDE (PF) 0.9 % IJ SOLN
INTRAMUSCULAR | Status: AC
  Filled 2022-03-23: qty 50

## 2022-03-30 ENCOUNTER — Other Ambulatory Visit (HOSPITAL_COMMUNITY): Payer: Self-pay

## 2022-03-30 ENCOUNTER — Ambulatory Visit (AMBULATORY_SURGERY_CENTER): Payer: No Typology Code available for payment source | Admitting: Internal Medicine

## 2022-03-30 ENCOUNTER — Encounter: Payer: Self-pay | Admitting: Internal Medicine

## 2022-03-30 VITALS — BP 118/73 | HR 79 | Temp 98.6°F | Resp 13 | Ht 64.0 in | Wt 169.0 lb

## 2022-03-30 DIAGNOSIS — K2970 Gastritis, unspecified, without bleeding: Secondary | ICD-10-CM | POA: Diagnosis not present

## 2022-03-30 DIAGNOSIS — K2980 Duodenitis without bleeding: Secondary | ICD-10-CM | POA: Diagnosis not present

## 2022-03-30 DIAGNOSIS — R1310 Dysphagia, unspecified: Secondary | ICD-10-CM | POA: Diagnosis not present

## 2022-03-30 DIAGNOSIS — D122 Benign neoplasm of ascending colon: Secondary | ICD-10-CM | POA: Diagnosis not present

## 2022-03-30 DIAGNOSIS — K2981 Duodenitis with bleeding: Secondary | ICD-10-CM | POA: Diagnosis not present

## 2022-03-30 DIAGNOSIS — K5900 Constipation, unspecified: Secondary | ICD-10-CM

## 2022-03-30 DIAGNOSIS — Z1211 Encounter for screening for malignant neoplasm of colon: Secondary | ICD-10-CM

## 2022-03-30 DIAGNOSIS — K2951 Unspecified chronic gastritis with bleeding: Secondary | ICD-10-CM | POA: Diagnosis not present

## 2022-03-30 DIAGNOSIS — K2100 Gastro-esophageal reflux disease with esophagitis, without bleeding: Secondary | ICD-10-CM

## 2022-03-30 MED ORDER — OMEPRAZOLE 40 MG PO CPDR
40.0000 mg | DELAYED_RELEASE_CAPSULE | Freq: Two times a day (BID) | ORAL | 3 refills | Status: DC
  Filled 2022-03-30: qty 90, 45d supply, fill #0

## 2022-03-30 MED ORDER — SODIUM CHLORIDE 0.9 % IV SOLN: 500.0000 mL | INTRAVENOUS | Status: DC

## 2022-03-30 NOTE — Op Note (Signed)
[facility] Patient Name: Kathryn Sexton Procedure Date: 03/30/2022 9:51 AM MRN: [id number] Endoscopist: Sonny Masters "Kathryn Sexton ,  Age: 51 Referring MD:  Date of Birth: 1970/11/07 Gender: Female Account #: [id number] Procedure:                Upper GI endoscopy Indications:              Dysphagia Medicines:                Monitored Anesthesia Care Procedure:                Pre-Anesthesia Assessment:                           - Prior to the procedure, a History and Physical                            was performed, and patient medications and                            allergies were reviewed. The patient's tolerance of                            previous anesthesia was also reviewed. The risks                            and benefits of the procedure and the sedation                            options and risks were discussed with the patient.                            All questions were answered, and informed consent                            was obtained. Prior Anticoagulants: The patient has                            taken no previous anticoagulant or antiplatelet                            agents. ASA Grade Assessment: II - A patient with                            mild systemic disease. After reviewing the risks                            and benefits, the patient was deemed in                            satisfactory condition to undergo the procedure.                           After obtaining informed consent, the endoscope was  passed under direct vision. Throughout the                            procedure, the patient's blood pressure, pulse, and                            oxygen saturations were monitored continuously. The                            GIF [id number] #[id number] was introduced through the                            mouth, and advanced to the second part of duodenum.                            The upper GI endoscopy was accomplished  without                            difficulty. The patient tolerated the procedure                            well. Scope In: Scope Out: Findings:                 The examined esophagus was normal. This was                            biopsied with a cold forceps for histology.                           Localized inflammation characterized by congestion                            (edema), erosions and erythema was found in the                            gastric fundus and in the gastric body. Biopsies                            were taken with a cold forceps for histology.                           The examined duodenum was normal. Biopsies were                            taken with a cold forceps for histology. Complications:            No immediate complications. Estimated Blood Loss:     Estimated blood loss was minimal. Impression:               - Normal esophagus. Biopsied.                           - Gastritis. Biopsied.                           -  Normal examined duodenum. Biopsied. Recommendation:           - Await pathology results.                           - Use Prilosec (omeprazole) 40 mg PO BID for 8                            weeks.                           - Perform a colonoscopy today. Sonny Masters "Kathryn Sexton,  03/30/2022 10:30:46 AM

## 2022-03-30 NOTE — Progress Notes (Signed)
GASTROENTEROLOGY PROCEDURE H&P NOTE   Primary Care Physician: Lujean Amel, MD    Reason for Procedure:   Dysphagia, colon cancer screening  Plan:    EGD/colonoscopy  Patient is appropriate for endoscopic procedure(s) in the ambulatory ([facility]) setting.  The nature of the procedure, as well as the risks, benefits, and alternatives were carefully and thoroughly reviewed with the patient. Ample time for discussion and questions allowed. The patient understood, was satisfied, and agreed to proceed.     HPI: Kathryn Sexton is a 51 y.o. female who presents for EGD/colonoscopy for evaluation of dysphagia .  Patient was most recently seen in the Gastroenterology Clinic on 03/03/22.  No interval change in medical history since that appointment. Please refer to that note for full details regarding GI history and clinical presentation.   Past Medical History:  Diagnosis Date   Diabetes mellitus without complication (HCC)    Type II   GERD (gastroesophageal reflux disease)    HLD (hyperlipidemia)    Hypertension    states BP changes, no medication   Uterine fibroid     Past Surgical History:  Procedure Laterality Date   BREAST BIOPSY Right 09/02/2018   Fibroadenoma   BREAST BIOPSY Right 09/02/2018   CSL   CESAREAN SECTION     ROBOT ASSISTED MYOMECTOMY N/A 01/09/2013   Procedure: ROBOTIC ASSISTED MYOMECTOMY;  Surgeon: [name], MD;  Location: [facility];  Service: Gynecology;  Laterality: N/A;   ROBOTIC ASSISTED LAPAROSCOPIC HYSTERECTOMY AND SALPINGECTOMY Bilateral 04/23/2020   Procedure: XI ROBOTIC ASSISTED TOTAL LAPAROSCOPIC HYSTERECTOMY AND SALPINGECTOMY;  Surgeon: [name], MD;  Location: [facility];  Service: Gynecology;  Laterality: Bilateral;   ROBOTIC ASSISTED LAPAROSCOPIC LYSIS OF ADHESION  04/23/2020   Procedure: XI ROBOTIC ASSISTED LAPAROSCOPIC EXTENSIVE LYSIS OF ADHESION;  Surgeon: [name], MD;  Location: [facility];   Service: Gynecology;;    Prior to Admission medications   Medication Sig Start Date End Date Taking? Authorizing Provider  amLODipine (NORVASC) 5 MG tablet TAKE 1 TABLET BY MOUTH ONCE DAILY. 12/10/21  Yes   atorvastatin (LIPITOR) 20 MG tablet Take 1 tablet by mouth once daily. 12/10/21  Yes   cholecalciferol (VITAMIN D) 1000 units tablet Take 1,000 Units by mouth at bedtime.    Yes [provider]  empagliflozin (JARDIANCE) 25 MG TABS tablet TAKE 1 TABLET BY MOUTH DAILY 12/10/21  Yes   hydrochlorothiazide (HYDRODIURIL) 25 MG tablet TAKE 1 TABLET BY MOUTH ONCE DAILY IN THE MORNING. 12/10/21  Yes   Multiple Vitamins-Minerals (CENTRUM PO) Take 1 tablet by mouth as needed.   Yes [provider]  Omega-3 Fatty Acids (OMEGA-3 PO) Take 2,400 mg by mouth daily.   Yes [provider]  Potassium 99 MG TABS Take 99 mg by mouth daily.    Yes [provider]  Propylene Glycol (SYSTANE COMPLETE) 0.6 % SOLN Place 1 drop into both eyes 3 (three) times daily as needed (dry/irritated eyes.).   Yes [provider]  cycloSPORINE (RESTASIS) 0.05 % ophthalmic emulsion Instill 1 drop into both eyes twice a day as directed 03/11/22     cycloSPORINE (RESTASIS) 0.05 % ophthalmic emulsion Instill 1 drop into both eyes twice a day as directed 03/11/22     glucose blood (FREESTYLE LITE) test strip Use as directed to check bl0od glucose once daily. 03/02/22     Lancets (FREESTYLE) lancets Use as directed to check blood glucose once daily. 03/02/22   Koirala, Dibas, MD  METAMUCIL FIBER  PO 2 tsp in liquid by mouth as needed    [provider]  polyethylene glycol powder (GLYCOLAX/MIRALAX) 17 GM/SCOOP powder Take 17 g by mouth daily. Patient not taking: Reported on 03/30/2022    [provider]  RESTASIS 0.05 % ophthalmic emulsion Place 1 drop into both eyes 2 (two) times daily.  03/26/20   [provider]    Current Outpatient Medications  Medication Sig Dispense  Refill   amLODipine (NORVASC) 5 MG tablet TAKE 1 TABLET BY MOUTH ONCE DAILY. 90 tablet 4   atorvastatin (LIPITOR) 20 MG tablet Take 1 tablet by mouth once daily. 90 tablet 4   cholecalciferol (VITAMIN D) 1000 units tablet Take 1,000 Units by mouth at bedtime.      empagliflozin (JARDIANCE) 25 MG TABS tablet TAKE 1 TABLET BY MOUTH DAILY 90 tablet 4   hydrochlorothiazide (HYDRODIURIL) 25 MG tablet TAKE 1 TABLET BY MOUTH ONCE DAILY IN THE MORNING. 90 tablet 4   Multiple Vitamins-Minerals (CENTRUM PO) Take 1 tablet by mouth as needed.     Omega-3 Fatty Acids (OMEGA-3 PO) Take 2,400 mg by mouth daily.     Potassium 99 MG TABS Take 99 mg by mouth daily.      Propylene Glycol (SYSTANE COMPLETE) 0.6 % SOLN Place 1 drop into both eyes 3 (three) times daily as needed (dry/irritated eyes.).     cycloSPORINE (RESTASIS) 0.05 % ophthalmic emulsion Instill 1 drop into both eyes twice a day as directed 180 each 3   cycloSPORINE (RESTASIS) 0.05 % ophthalmic emulsion Instill 1 drop into both eyes twice a day as directed 180 mL 3   glucose blood (FREESTYLE LITE) test strip Use as directed to check bl0od glucose once daily. 100 strip 3   Lancets (FREESTYLE) lancets Use as directed to check blood glucose once daily. 100 each 3   METAMUCIL FIBER PO 2 tsp in liquid by mouth as needed     polyethylene glycol powder (GLYCOLAX/MIRALAX) 17 GM/SCOOP powder Take 17 g by mouth daily. (Patient not taking: Reported on 03/30/2022)     RESTASIS 0.05 % ophthalmic emulsion Place 1 drop into both eyes 2 (two) times daily.      Current Facility-Administered Medications  Medication Dose Route Frequency Provider Last Rate Last Admin   0.9 %  sodium chloride infusion  500 mL Intravenous Continuous Sharyn Creamer, MD        Allergies as of 03/30/2022 - Review Complete 03/30/2022  Allergen Reaction Noted   Metformin Itching 04/05/2020   Other  07/03/2020    Family History  Problem Relation Age of Onset   Hypertension Mother     Diabetes Mother    Thyroid nodules Sister    Hyperlipidemia Brother    Hypertension Brother    Diabetes Brother    Hypotension Maternal Grandmother    Hypertension Paternal Grandmother    Diabetes Maternal Uncle    Kidney disease Maternal Uncle    Stroke Neg Hx    Cancer Neg Hx    Heart disease Neg Hx     Social History   Socioeconomic History   Marital status: Married    Spouse name: Not on file   Number of children: 0   Years of education: Not on file   Highest education level: Not on file  Occupational History   Occupation: Pharmacy Tech  Tobacco Use   Smoking status: Never   Smokeless tobacco: Never  Vaping Use   Vaping Use: Never used  Substance and Sexual  Activity   Alcohol use: No   Drug use: No   Sexual activity: Yes    Partners: Male    Birth control/protection: None    Comment: 1st intercourse- 73, partners- 2, married- 8 yrs   Other Topics Concern   Not on file  Social History Narrative   Caffienated drinks-yes   Seat belt use often-yes   Regular Exercise-no   Smoke alarm in the home-yes   Firearms/guns in the home-no   History of physical abuse-no         Social Determinants of Health   Financial Resource Strain: Not on file  Food Insecurity: Not on file  Transportation Needs: Not on file  Physical Activity: Not on file  Stress: Not on file  Social Connections: Not on file  Intimate Partner Violence: Not on file    Physical Exam: Vital signs in last 24 hours: BP (!) 141/80   Pulse 81   Temp 98.6 F (37 C)   Ht '5\' 4"'$  (1.626 m)   Wt 169 lb (76.7 kg)   LMP 03/28/2020 (Exact Date)   SpO2 100%   BMI 29.01 kg/m  GEN: NAD EYE: Sclerae anicteric ENT: MMM CV: Non-tachycardic Pulm: No increased WOB GI: Soft NEURO:  Alert & Oriented   [name], MD [facility]   03/30/2022 9:47 AM

## 2022-03-30 NOTE — Progress Notes (Signed)
Sedate, gd SR, tolerated procedure well, VSS, report to RN 

## 2022-03-30 NOTE — Patient Instructions (Signed)
Resume previous diet and medications. Awaiting pathology results. Return to GI office in 4 weeks. Take Prilosec 40 MG by mouth twice daily for 8 weeks.  YOU HAD AN ENDOSCOPIC PROCEDURE TODAY AT [facility]:   Refer to the procedure report that was given to you for any specific questions about what was found during the examination.  If the procedure report does not answer your questions, please call your gastroenterologist to clarify.  If you requested that your care partner not be given the details of your procedure findings, then the procedure report has been included in a sealed envelope for you to review at your convenience later.  YOU SHOULD EXPECT: Some feelings of bloating in the abdomen. Passage of more gas than usual.  Walking can help get rid of the air that was put into your GI tract during the procedure and reduce the bloating. If you had a lower endoscopy (such as a colonoscopy or flexible sigmoidoscopy) you may notice spotting of blood in your stool or on the toilet paper. If you underwent a bowel prep for your procedure, you may not have a normal bowel movement for a few days.  Please Note:  You might notice some irritation and congestion in your nose or some drainage.  This is from the oxygen used during your procedure.  There is no need for concern and it should clear up in a day or so.  SYMPTOMS TO REPORT IMMEDIATELY:  Following lower endoscopy (colonoscopy or flexible sigmoidoscopy):  Excessive amounts of blood in the stool  Significant tenderness or worsening of abdominal pains  Swelling of the abdomen that is new, acute  Fever of 100F or higher  Following upper endoscopy (EGD)  Vomiting of blood or coffee ground material  New chest pain or pain under the shoulder blades  Painful or persistently difficult swallowing  New shortness of breath  Fever of 100F or higher  Black, tarry-looking stools  For urgent or emergent issues, a gastroenterologist can be  reached at any hour by calling [phone]. Do not use MyChart messaging for urgent concerns.    DIET:  We do recommend a small meal at first, but then you may proceed to your regular diet.  Drink plenty of fluids but you should avoid alcoholic beverages for 24 hours.  ACTIVITY:  You should plan to take it easy for the rest of today and you should NOT DRIVE or use heavy machinery until tomorrow (because of the sedation medicines used during the test).    FOLLOW UP: Our staff will call the number listed on your records the next business day following your procedure.  We will call around 7:15- 8:00 am to check on you and address any questions or concerns that you may have regarding the information given to you following your procedure. If we do not reach you, we will leave a message.  If you develop any symptoms (ie: fever, flu-like symptoms, shortness of breath, cough etc.) before then, please call [phone].  If you test positive for Covid 19 in the 2 weeks post procedure, please call and report this information to [place].    If any biopsies were taken you will be contacted by phone or by letter within the next 1-3 weeks.  Please call us at [phone] if you have not heard about the biopsies in 3 weeks.    SIGNATURES/CONFIDENTIALITY: You and/or your care partner have signed paperwork which will be entered into your electronic medical record.  These  signatures attest to the fact that that the information above on your After Visit Summary has been reviewed and is understood.  Full responsibility of the confidentiality of this discharge information lies with you and/or your care-partner.

## 2022-03-30 NOTE — Op Note (Signed)
[facility] Patient Name: Kathryn Sexton Procedure Date: 03/30/2022 9:52 AM MRN: [id number] Endoscopist: Sonny Masters "Kathryn Sexton ,  Age: 51 Referring MD:  Date of Birth: [birth year] Gender: Female Account #: [id number] Procedure:                Colonoscopy Indications:              Screening for colorectal malignant neoplasm Medicines:                Monitored Anesthesia Care Procedure:                Pre-Anesthesia Assessment:                           - Prior to the procedure, a History and Physical                            was performed, and patient medications and                            allergies were reviewed. The patient's tolerance of                            previous anesthesia was also reviewed. The risks                            and benefits of the procedure and the sedation                            options and risks were discussed with the patient.                            All questions were answered, and informed consent                            was obtained. Prior Anticoagulants: The patient has                            taken no previous anticoagulant or antiplatelet                            agents. ASA Grade Assessment: II - A patient with                            mild systemic disease. After reviewing the risks                            and benefits, the patient was deemed in                            satisfactory condition to undergo the procedure.                           After obtaining informed consent, the colonoscope  was passed under direct vision. Throughout the                            procedure, the patient's blood pressure, pulse, and                            oxygen saturations were monitored continuously. The                            CF [id number] #[id number] was introduced through the anus                            and advanced to the the cecum, identified by                            appendiceal  orifice and ileocecal valve. The                            colonoscopy was performed without difficulty. The                            patient tolerated the procedure well. The quality                            of the bowel preparation was good. The ileocecal                            valve, appendiceal orifice, and rectum were                            photographed. Scope In: 10:02:23 AM Scope Out: 10:25:55 AM Scope Withdrawal Time: 0 hours 15 minutes 53 seconds  Total Procedure Duration: 0 hours 23 minutes 32 seconds  Findings:                 A 2 mm polyp was found in the ascending colon. The                            polyp was sessile. The polyp was removed with a                            cold biopsy forceps. Resection and retrieval were                            complete.                           Non-bleeding internal hemorrhoids were found during                            retroflexion. Complications:            No immediate complications. Estimated Blood Loss:     Estimated blood loss was minimal. Impression:               - One 2  mm polyp in the ascending colon, removed                            with a cold biopsy forceps. Resected and retrieved.                           - Non-bleeding internal hemorrhoids. Recommendation:           - Discharge patient to home (with escort).                           - Await pathology results.                           - The findings and recommendations were discussed                            with the patient.                           - Return to GI clinic in 4 weeks. Sonny Masters "Kathryn Sexton" [place],  03/30/2022 10:33:07 AM

## 2022-03-30 NOTE — Progress Notes (Signed)
Called to room to assist during endoscopic procedure.  Patient ID and intended procedure confirmed with present staff. Received instructions for my participation in the procedure from the performing physician.  

## 2022-04-01 ENCOUNTER — Telehealth: Payer: Self-pay

## 2022-04-01 NOTE — Telephone Encounter (Signed)
  Follow up Call-     03/30/2022    9:16 AM  Call back number  Post procedure Call Back phone  # [phone]  Permission to leave phone message Yes     Patient questions:  Do you have a fever, pain , or abdominal swelling? No. Pain Score  0 *  Have you tolerated food without any problems? Yes.    Have you been able to return to your normal activities? Yes.    Do you have any questions about your discharge instructions: Diet   No. Medications  No. Follow up visit  No.  Do you have questions or concerns about your Care? No.  Actions: * If pain score is 4 or above: No action needed, pain <4.

## 2022-04-03 ENCOUNTER — Other Ambulatory Visit: Payer: Self-pay | Admitting: Family Medicine

## 2022-04-03 DIAGNOSIS — E041 Nontoxic single thyroid nodule: Secondary | ICD-10-CM

## 2022-04-06 ENCOUNTER — Other Ambulatory Visit (HOSPITAL_COMMUNITY): Payer: Self-pay

## 2022-04-06 MED ORDER — LOSARTAN POTASSIUM 25 MG PO TABS
ORAL_TABLET | ORAL | 11 refills | Status: DC
  Filled 2022-04-06: qty 30, 30d supply, fill #0
  Filled 2022-04-21 – 2022-05-05 (×2): qty 90, 90d supply, fill #1
  Filled 2022-07-31: qty 90, 90d supply, fill #2
  Filled 2022-11-11: qty 90, 90d supply, fill #3
  Filled 2023-03-03: qty 60, 60d supply, fill #4

## 2022-04-07 ENCOUNTER — Other Ambulatory Visit (HOSPITAL_COMMUNITY): Payer: Self-pay

## 2022-04-07 MED ORDER — PAXLOVID (300/100) 20 X 150 MG & 10 X 100MG PO TBPK
ORAL_TABLET | ORAL | 0 refills | Status: DC
  Filled 2022-04-07: qty 30, 5d supply, fill #0

## 2022-04-09 ENCOUNTER — Encounter: Payer: Self-pay | Admitting: Internal Medicine

## 2022-04-17 ENCOUNTER — Ambulatory Visit
Admission: RE | Admit: 2022-04-17 | Discharge: 2022-04-17 | Disposition: A | Discharge status: Home / Self Care | Payer: No Typology Code available for payment source | Source: Ambulatory Visit | Attending: Family Medicine | Admitting: Family Medicine

## 2022-04-17 DIAGNOSIS — E041 Nontoxic single thyroid nodule: Secondary | ICD-10-CM

## 2022-04-21 ENCOUNTER — Other Ambulatory Visit (HOSPITAL_COMMUNITY): Payer: Self-pay

## 2022-04-23 ENCOUNTER — Other Ambulatory Visit: Payer: Self-pay | Admitting: Family Medicine

## 2022-04-23 DIAGNOSIS — E041 Nontoxic single thyroid nodule: Secondary | ICD-10-CM

## 2022-04-28 ENCOUNTER — Other Ambulatory Visit (HOSPITAL_COMMUNITY): Payer: Self-pay | Admitting: Family Medicine

## 2022-04-28 DIAGNOSIS — E041 Nontoxic single thyroid nodule: Secondary | ICD-10-CM

## 2022-05-05 ENCOUNTER — Other Ambulatory Visit (HOSPITAL_COMMUNITY): Payer: Self-pay

## 2022-05-14 ENCOUNTER — Other Ambulatory Visit: Payer: No Typology Code available for payment source

## 2022-05-22 ENCOUNTER — Ambulatory Visit (HOSPITAL_COMMUNITY)
Admission: RE | Admit: 2022-05-22 | Discharge: 2022-05-22 | Disposition: A | Discharge status: Home / Self Care | Payer: Commercial Managed Care - HMO | Source: Ambulatory Visit | Attending: Family Medicine | Admitting: Family Medicine

## 2022-05-22 DIAGNOSIS — E041 Nontoxic single thyroid nodule: Secondary | ICD-10-CM

## 2022-05-22 DIAGNOSIS — E042 Nontoxic multinodular goiter: Secondary | ICD-10-CM | POA: Diagnosis not present

## 2022-05-22 MED ORDER — LIDOCAINE HCL (PF) 1 % IJ SOLN
INTRAMUSCULAR | Status: AC
  Filled 2022-05-22: qty 30

## 2022-05-22 NOTE — Procedures (Addendum)
PROCEDURE SUMMARY:  Technically successful thyroid FNA for both right isthmus and right mid nodules.  Ultrasound was used to confirm needle placements on all occasions.   EBL = trace  Specimens were sent to Pathology for analysis.  See dictation in Epic for full procedure details.  Patient came in for thyroid FNA of nodule #1, nodule #2 was accidentally aspirated. Patient was informed about the incident, nodule #1 was aspirated as well, after obtaining patient's consent. Pathology send for both.  Attempted to inform the ordering provider, was not able to reach. Please call [facility] Interventional radiology for questions and concerns.   Armando Gang Debanhi Blaker PA-C 05/22/2022 2:32 PM

## 2022-05-27 LAB — CYTOLOGY - NON PAP

## 2022-06-05 ENCOUNTER — Other Ambulatory Visit: Payer: Self-pay | Admitting: Family Medicine

## 2022-06-05 ENCOUNTER — Ambulatory Visit
Admission: RE | Admit: 2022-06-05 | Discharge: 2022-06-05 | Disposition: A | Discharge status: Home / Self Care | Payer: Commercial Managed Care - HMO | Source: Ambulatory Visit | Attending: Family Medicine | Admitting: Family Medicine

## 2022-06-05 DIAGNOSIS — C73 Malignant neoplasm of thyroid gland: Secondary | ICD-10-CM

## 2022-06-05 MED ORDER — IOPAMIDOL (ISOVUE-300) INJECTION 61%
75.0000 mL | Freq: Once | INTRAVENOUS | Status: AC | PRN
  Administered 2022-06-05: 75 mL via INTRAVENOUS

## 2022-06-30 ENCOUNTER — Encounter (HOSPITAL_COMMUNITY): Payer: Self-pay

## 2022-07-02 ENCOUNTER — Ambulatory Visit: Payer: Self-pay | Admitting: Surgery

## 2022-07-06 ENCOUNTER — Encounter (HOSPITAL_COMMUNITY): Payer: Self-pay

## 2022-07-06 ENCOUNTER — Encounter: Payer: Self-pay | Admitting: Surgery

## 2022-07-06 ENCOUNTER — Encounter (HOSPITAL_COMMUNITY)
Admission: RE | Admit: 2022-07-06 | Discharge: 2022-07-06 | Disposition: A | Discharge status: Home / Self Care | Payer: Commercial Managed Care - HMO | Source: Ambulatory Visit | Attending: Surgery | Admitting: Surgery

## 2022-07-06 VITALS — BP 122/87 | HR 103 | Temp 98.2°F | Resp 12 | Ht 65.0 in | Wt 170.0 lb

## 2022-07-06 DIAGNOSIS — Z01818 Encounter for other preprocedural examination: Secondary | ICD-10-CM | POA: Diagnosis present

## 2022-07-06 DIAGNOSIS — I2510 Atherosclerotic heart disease of native coronary artery without angina pectoris: Secondary | ICD-10-CM | POA: Diagnosis not present

## 2022-07-06 DIAGNOSIS — E042 Nontoxic multinodular goiter: Secondary | ICD-10-CM | POA: Diagnosis present

## 2022-07-06 DIAGNOSIS — C73 Malignant neoplasm of thyroid gland: Secondary | ICD-10-CM | POA: Diagnosis present

## 2022-07-06 DIAGNOSIS — E119 Type 2 diabetes mellitus without complications: Secondary | ICD-10-CM

## 2022-07-06 LAB — HEMOGLOBIN A1C
Hgb A1c MFr Bld: 7.2 % — ABNORMAL HIGH (ref 4.8–5.6)
Mean Plasma Glucose: 159.94 mg/dL

## 2022-07-06 LAB — CBC
HCT: 42.7 % (ref 36.0–46.0)
Hemoglobin: 13.8 g/dL (ref 12.0–15.0)
MCH: 27.5 pg (ref 26.0–34.0)
MCHC: 32.3 g/dL (ref 30.0–36.0)
MCV: 85.2 fL (ref 80.0–100.0)
Platelets: 302 10*3/uL (ref 150–400)
RBC: 5.01 MIL/uL (ref 3.87–5.11)
RDW: 13.5 % (ref 11.5–15.5)
WBC: 10.3 10*3/uL (ref 4.0–10.5)
nRBC: 0 % (ref 0.0–0.2)

## 2022-07-06 LAB — BASIC METABOLIC PANEL
Anion gap: 8 (ref 5–15)
BUN: 19 mg/dL (ref 6–20)
CO2: 24 mmol/L (ref 22–32)
Calcium: 8.9 mg/dL (ref 8.9–10.3)
Chloride: 105 mmol/L (ref 98–111)
Creatinine, Ser: 0.82 mg/dL (ref 0.44–1.00)
GFR, Estimated: >60 mL/min (ref >60)
Glucose, Bld: 149 mg/dL — ABNORMAL HIGH (ref 70–99)
Potassium: 3.6 mmol/L (ref 3.5–5.1)
Sodium: 137 mmol/L (ref 135–145)

## 2022-07-06 LAB — GLUCOSE, CAPILLARY: Glucose-Capillary: 156 mg/dL — ABNORMAL HIGH (ref 70–99)

## 2022-07-06 NOTE — Progress Notes (Signed)
COVID Vaccine Completed: yes  Date of COVID positive in last 90 days: no  PCP - Dibas Koirala, MD Cardiologist - n/a  Chest x-ray - n/a  EKG - 07/06/22 Epic/chart Stress Test - n/a ECHO - n/a Cardiac Cath - n/a Pacemaker/ICD device last checked: n/a Spinal Cord Stimulator: n/a  Bowel Prep - no  Sleep Study - n/a CPAP -   Fasting Blood Sugar - 100-125 Checks Blood Sugar few times a week  Blood Thinner Instructions: n/a Aspirin Instructions: Last Dose:  Activity level: Can go up a flight of stairs and perform activities of daily living without stopping and without symptoms of chest pain. Endorses SOB with strenuous activity    Anesthesia review:   Patient denies shortness of breath, fever, cough and chest pain at PAT appointment  Patient verbalized understanding of instructions that were given to them at the PAT appointment. Patient was also instructed that they will need to review over the PAT instructions again at home before surgery.

## 2022-07-06 NOTE — H&P (Signed)
REFERRING PHYSICIAN: Koirala, Dibas, MD  PROVIDER: Rosell Khouri Charlotta Newton, MD   Chief Complaint: New Consultation (Papillary thyroid carcinoma)  History of Present Illness:  Patient is referred by her primary care physician, Dr. Lujean Amel, for surgical evaluation and management of newly diagnosed papillary thyroid carcinoma. Patient had been seen for routine physical examination this past summer. Following her examination she noted a swelling or mass in the anterior neck. She contacted her primary care physician and was sent for an ultrasound on April 17, 2022. This demonstrated a normal-sized thyroid gland containing bilateral thyroid nodules. There was a highly suspicious mass in the isthmus measuring 1.4 cm. It appeared to have extrathyroidal extension. Fine-needle aspiration biopsy was recommended. This was performed on May 22, 2022. It showed findings consistent with papillary thyroid carcinoma, Bethesda category VI. Patient is now referred for surgical consultation for management of papillary thyroid carcinoma. Patient has no prior history of thyroid disease. She has never been on thyroid medication. She has had no prior head or neck surgery. There is a family history of thyroid disease in the patient's mother who underwent thyroidectomy, presumably for goiter. Patient is a Art gallery manager at [employer].  Review of Systems: A complete review of systems was obtained from the patient. I have reviewed this information and discussed as appropriate with the patient. See HPI as well for other ROS.  Review of Systems  Constitutional: Negative.  HENT: Negative.  Eyes: Negative.  Respiratory: Negative.  Cardiovascular: Negative.  Gastrointestinal: Negative.  Genitourinary: Negative.  Musculoskeletal: Negative.  Skin: Negative.  Neurological: Negative.  Endo/Heme/Allergies: Negative.  Psychiatric/Behavioral: Negative.   Medical History: Past Medical History:   Diagnosis Date  Diabetes mellitus without complication (CMS-HCC)  GERD (gastroesophageal reflux disease)  History of cancer  Hyperlipidemia  Hypertension   Patient Active Problem List  Diagnosis  Papillary thyroid carcinoma (CMS-HCC)  Multiple thyroid nodules   Past Surgical History:  Procedure Laterality Date  CESAREAN SECTION  HYSTERECTOMY    Allergies  Allergen Reactions  Adhesive Itching  Metformin Itching  NDC JSHF:[id number] NDC YDXA:[id number]   Current Outpatient Medications on File Prior to Visit  Medication Sig Dispense Refill  amLODIPine (NORVASC) 5 MG tablet Take 1 tablet by mouth once daily  atorvastatin (LIPITOR) 20 MG tablet Take 1 tablet by mouth once daily  hydroCHLOROthiazide (HYDRODIURIL) 25 MG tablet Take 1 tablet by mouth every morning  losartan (COZAAR) 25 MG tablet Take 1 tablet by mouth once daily  omeprazole (PRILOSEC) 40 MG DR capsule Take 40 mg by mouth 2 (two) times daily  potassium gluconate 2.5 mEq Tab Take by mouth   No current facility-administered medications on file prior to visit.   Family History  Problem Relation Age of Onset  High blood pressure (Hypertension) Mother  Diabetes Mother  Diabetes Brother  High blood pressure (Hypertension) Brother  Hyperlipidemia (Elevated cholesterol) Brother    Social History   Tobacco Use  Smoking Status Never  Smokeless Tobacco Never    Social History   Socioeconomic History  Marital status: Married  Tobacco Use  Smoking status: Never  Smokeless tobacco: Never  Substance and Sexual Activity  Alcohol use: Not Currently  Drug use: Never   Objective:   Vitals:  BP: (!) 138/95  Pulse: 104  Temp: 36.7 C (98 F)  SpO2: 99%  Weight: 77.1 kg (170 lb)  Height: 165.1 cm ('5\' 5"'$ )   Body mass index is 28.29 kg/m.  Physical Exam   GENERAL APPEARANCE Comfortable, no  acute issues Development: normal Gross deformities: none  SKIN Rash, lesions, ulcers: none Induration,  erythema: none Nodules: none palpable  EYES Conjunctiva and lids: normal Pupils: equal and reactive  EARS, NOSE, MOUTH, THROAT External ears: no lesion or deformity External nose: no lesion or deformity Hearing: grossly normal  NECK Symmetric: yes Trachea: midline Thyroid: There is a visible nodule in the anterior midline just slightly to the right of center. On palpation there is a firm irregular mass measuring approximately 2 cm in greatest dimension, nontender, mobile. Palpation on the left and right thyroid lobes show no dominant nor discrete masses. There is no associated lymphadenopathy.  CHEST Respiratory effort: normal Retraction or accessory muscle use: no Breath sounds: normal bilaterally Rales, rhonchi, wheeze: none  CARDIOVASCULAR Auscultation: regular rhythm, normal rate Murmurs: none Pulses: radial pulse 2+ palpable Lower extremity edema: none  ABDOMEN Not assessed  GENITOURINARY/RECTAL Not assessed  MUSCULOSKELETAL Station and gait: normal Digits and nails: no clubbing or cyanosis Muscle strength: grossly normal all extremities Range of motion: grossly normal all extremities Deformity: none  LYMPHATIC Cervical: none palpable Supraclavicular: none palpable  PSYCHIATRIC Oriented to person, place, and time: yes Mood and affect: normal for situation Judgment and insight: appropriate for situation   Assessment and Plan:   Papillary thyroid carcinoma (CMS-HCC)  Multiple thyroid nodules  Patient is referred by her primary care physician for surgical management of newly diagnosed papillary thyroid carcinoma.  Patient provided with a copy of "The Thyroid Book: Medical and Surgical Treatment of Thyroid Problems", published by [employer], 16 pages. Book reviewed and explained to patient during visit today.  Today we reviewed her clinical history, her ultrasound report, and her fine-needle aspiration biopsy results. We discussed proceeding with thyroid  surgery. Because the patient has bilateral thyroid nodules and her papillary carcinoma is located centrally in the isthmus, I have recommended total thyroidectomy with limited central compartment lymph node dissection. We discussed the risk and benefits of the procedure. This includes the risk of recurrent laryngeal nerve injury and injury to parathyroid glands. We discussed the size and location of the surgical incision. We discussed the hospitalization to be anticipated. We discussed her postoperative recovery. We discussed the need for lifelong thyroid hormone replacement. We discussed the potential need for radioactive iodine treatment. We will plan to arrange for endocrinology consultation following surgery when the final pathology results are available for review. The patient understands and agrees with this plan and would like to proceed with surgery as soon as possible.   Armandina Gemma, MD [facility] A [place] practice Office: [phone]

## 2022-07-06 NOTE — Patient Instructions (Addendum)
SURGICAL WAITING ROOM VISITATION Patients having surgery or a procedure may have no more than 2 support people in the waiting area - these visitors may rotate.   Children under the age of 1 must have an adult with them who is not the patient. If the patient needs to stay at the hospital during part of their recovery, the visitor guidelines for inpatient rooms apply. Pre-op nurse will coordinate an appropriate time for 1 support person to accompany patient in pre-op.  This support person may not rotate.    Please refer to the [facility] website for the visitor guidelines for Inpatients (after your surgery is over and you are in a regular room).      Your procedure is scheduled on: 07-09-22   Report to [facility] Main Entrance    Report to admitting at 7:15 AM   Call this number if you have problems the morning of surgery [phone]   Do not eat food :After Midnight.   After Midnight you may have the following liquids until 6:30 AM DAY OF SURGERY  Water Non-Citrus Juices (without pulp, NO RED) Carbonated Beverages Black Coffee (NO MILK/CREAM OR CREAMERS, sugar ok)  Clear Tea (NO MILK/CREAM OR CREAMERS, sugar ok) regular and decaf                             Plain Jell-O (NO RED)                                           Fruit ices (not with fruit pulp, NO RED)                                     Popsicles (NO RED)                                                               Sports drinks like Gatorade (NO RED)                       If you have questions, please contact your surgeon's office.   FOLLOW  ANY ADDITIONAL PRE OP INSTRUCTIONS YOU RECEIVED FROM YOUR SURGEON'S OFFICE!!!     Oral Hygiene is also important to reduce your risk of infection.                                    Remember - BRUSH YOUR TEETH THE MORNING OF SURGERY WITH YOUR REGULAR TOOTHPASTE   Take these medicines the morning of surgery with A SIP OF WATER:   Amlodipine  Atorvastatin  How to  Manage Your Diabetes Before and After Surgery  Why is it important to control my blood sugar before and after surgery? Improving blood sugar levels before and after surgery helps healing and can limit problems. A way of improving blood sugar control is eating a healthy diet by:  Eating less sugar and carbohydrates  Increasing activity/exercise  Talking with your doctor about reaching your  blood sugar goals High blood sugars (greater than 180 mg/dL) can raise your risk of infections and slow your recovery, so you will need to focus on controlling your diabetes during the weeks before surgery. Make sure that the doctor who takes care of your diabetes knows about your planned surgery including the date and location.  How do I manage my blood sugar before surgery? Check your blood sugar at least 4 times a day, starting 2 days before surgery, to make sure that the level is not too high or low. Check your blood sugar the morning of your surgery when you wake up and every 2 hours until you get to the Short Stay unit. If your blood sugar is less than 70 mg/dL, you will need to treat for low blood sugar: Do not take insulin. Treat a low blood sugar (less than 70 mg/dL) with  cup of clear juice (cranberry or apple), 4 glucose tablets, OR glucose gel. Recheck blood sugar in 15 minutes after treatment (to make sure it is greater than 70 mg/dL). If your blood sugar is not greater than 70 mg/dL on recheck, call [phone] for further instructions. Report your blood sugar to the short stay nurse when you get to Short Stay.  If you are admitted to the hospital after surgery: Your blood sugar will be checked by the staff and you will probably be given insulin after surgery (instead of oral diabetes medicines) to make sure you have good blood sugar levels. The goal for blood sugar control after surgery is 80-180 mg/dL.   WHAT DO I DO ABOUT MY DIABETES MEDICATION?  Hold Jardiance three days before  surgery   Reviewed and Endorsed by [facility] Patient Education Committee, August 2015              You may not have any metal on your body including hair pins, jewelry, and body piercing             Do not wear make-up, lotions, powders, perfumes or deodorant  Do not wear nail polish including gel and S&S, artificial/acrylic nails, or any other type of covering on natural nails including finger and toenails. If you have artificial nails, gel coating, etc. that needs to be removed by a nail salon please have this removed prior to surgery or surgery may need to be canceled/ delayed if the surgeon/ anesthesia feels like they are unable to be safely monitored.   Do not shave  48 hours prior to surgery.    Do not bring valuables to the hospital. [place]   Bring small overnight bag day of surgery.   DO NOT [place]. PHARMACY WILL DISPENSE MEDICATIONS LISTED ON YOUR MEDICATION LIST TO YOU DURING YOUR ADMISSION [place]!  Please read over the following fact sheets you were given: IF [place] Apolonio Schneiders   If you received a COVID test during your pre-op visit  it is requested that you wear a mask when out in public, stay away from anyone that may not be feeling well and notify your surgeon if you develop symptoms. If you test positive for Covid or have been in contact with anyone that has tested positive in the last 10 days please notify you surgeon.  [place] - Preparing for Surgery Before surgery, you can play an important role.  Because skin is not sterile, your skin needs  to be as free of germs as possible.  You can reduce the number of germs on your skin by washing with CHG (chlorahexidine gluconate) soap before surgery.  CHG is an antiseptic cleaner which kills germs and bonds with the skin to continue killing germs even after washing. Please DO NOT  use if you have an allergy to CHG or antibacterial soaps.  If your skin becomes reddened/irritated stop using the CHG and inform your nurse when you arrive at Short Stay. Do not shave (including legs and underarms) for at least 48 hours prior to the first CHG shower.  You may shave your face/neck.  Please follow these instructions carefully:  1.  Shower with CHG Soap the night before surgery and the  morning of surgery.  2.  If you choose to wash your hair, wash your hair first as usual with your normal  shampoo.  3.  After you shampoo, rinse your hair and body thoroughly to remove the shampoo.                             4.  Use CHG as you would any other liquid soap.  You can apply chg directly to the skin and wash.  Gently with a scrungie or clean washcloth.  5.  Apply the CHG Soap to your body ONLY FROM THE NECK DOWN.   Do   not use on face/ open                           Wound or open sores. Avoid contact with eyes, ears mouth and   genitals (private parts).                       Wash face,  Genitals (private parts) with your normal soap.             6.  Wash thoroughly, paying special attention to the area where your    surgery  will be performed.  7.  Thoroughly rinse your body with warm water from the neck down.  8.  DO NOT shower/wash with your normal soap after using and rinsing off the CHG Soap.                9.  Pat yourself dry with a clean towel.            10.  Wear clean pajamas.            11.  Place clean sheets on your bed the night of your first shower and do not  sleep with pets. Day of Surgery : Do not apply any lotions/deodorants the morning of surgery.  Please wear clean clothes to the hospital/surgery center.  FAILURE TO FOLLOW THESE INSTRUCTIONS MAY RESULT IN THE CANCELLATION OF YOUR SURGERY  PATIENT SIGNATURE_________________________________  NURSE  SIGNATURE__________________________________  ________________________________________________________________________

## 2022-07-07 ENCOUNTER — Other Ambulatory Visit: Payer: No Typology Code available for payment source

## 2022-07-08 NOTE — Anesthesia Preprocedure Evaluation (Addendum)
Anesthesia Evaluation  Patient identified by MRN, date of birth, ID band Patient awake    Reviewed: Allergy & Precautions, NPO status , Patient's Chart, lab work & pertinent test results  History of Anesthesia Complications Negative for: history of anesthetic complications  Airway Mallampati: I  TM Distance: >3 FB Neck ROM: Full    Dental no notable dental hx. (+) Dental Advisory Given, Teeth Intact   Pulmonary neg pulmonary ROS,    Pulmonary exam normal        Cardiovascular hypertension, Pt. on medications negative cardio ROS Normal cardiovascular exam     Neuro/Psych negative neurological ROS  negative psych ROS   GI/Hepatic negative GI ROS, Neg liver ROS, GERD  Medicated,  Endo/Other  negative endocrine ROSdiabetes, Type 2  Renal/GU negative Renal ROS  negative genitourinary   Musculoskeletal negative musculoskeletal ROS (+)   Abdominal   Peds negative pediatric ROS (+)  Hematology negative hematology ROS (+)   Anesthesia Other Findings   Reproductive/Obstetrics negative OB ROS                            Anesthesia Physical  Anesthesia Plan  ASA: 2  Anesthesia Plan: General   Post-op Pain Management: Tylenol PO (pre-op)* and Celebrex PO (pre-op)*   Induction: Intravenous  PONV Risk Score and Plan: 4 or greater and Ondansetron, Dexamethasone, Midazolam and Scopolamine patch - Pre-op  Airway Management Planned: Oral ETT  Additional Equipment: None  Intra-op Plan:   Post-operative Plan: Extubation in OR  Informed Consent: I have reviewed the patients History and Physical, chart, labs and discussed the procedure including the risks, benefits and alternatives for the proposed anesthesia with the patient or authorized representative who has indicated his/her understanding and acceptance.     Dental advisory given  Plan Discussed with: Anesthesiologist and  CRNA  Anesthesia Plan Comments:         Anesthesia Quick Evaluation

## 2022-07-09 ENCOUNTER — Encounter (HOSPITAL_COMMUNITY)
Admission: RE | Disposition: A | Discharge status: Home / Self Care | Payer: Self-pay | Source: Ambulatory Visit | Attending: Surgery

## 2022-07-09 ENCOUNTER — Ambulatory Visit (HOSPITAL_BASED_OUTPATIENT_CLINIC_OR_DEPARTMENT_OTHER): Payer: Commercial Managed Care - HMO | Admitting: Anesthesiology

## 2022-07-09 ENCOUNTER — Ambulatory Visit (HOSPITAL_COMMUNITY)
Admission: RE | Admit: 2022-07-09 | Discharge: 2022-07-10 | Disposition: A | Discharge status: Home / Self Care | Payer: Commercial Managed Care - HMO | Source: Ambulatory Visit | Attending: Surgery | Admitting: Surgery

## 2022-07-09 ENCOUNTER — Ambulatory Visit (HOSPITAL_COMMUNITY): Payer: Commercial Managed Care - HMO | Admitting: Anesthesiology

## 2022-07-09 ENCOUNTER — Encounter (HOSPITAL_COMMUNITY): Payer: Self-pay | Admitting: Surgery

## 2022-07-09 ENCOUNTER — Other Ambulatory Visit: Payer: Self-pay

## 2022-07-09 DIAGNOSIS — E042 Nontoxic multinodular goiter: Secondary | ICD-10-CM | POA: Diagnosis not present

## 2022-07-09 DIAGNOSIS — C73 Malignant neoplasm of thyroid gland: Secondary | ICD-10-CM

## 2022-07-09 DIAGNOSIS — Z01818 Encounter for other preprocedural examination: Secondary | ICD-10-CM

## 2022-07-09 DIAGNOSIS — I2510 Atherosclerotic heart disease of native coronary artery without angina pectoris: Secondary | ICD-10-CM

## 2022-07-09 DIAGNOSIS — I10 Essential (primary) hypertension: Secondary | ICD-10-CM | POA: Insufficient documentation

## 2022-07-09 DIAGNOSIS — K219 Gastro-esophageal reflux disease without esophagitis: Secondary | ICD-10-CM | POA: Diagnosis not present

## 2022-07-09 DIAGNOSIS — Z8349 Family history of other endocrine, nutritional and metabolic diseases: Secondary | ICD-10-CM | POA: Diagnosis not present

## 2022-07-09 DIAGNOSIS — E119 Type 2 diabetes mellitus without complications: Secondary | ICD-10-CM

## 2022-07-09 HISTORY — PX: THYROIDECTOMY: SHX17

## 2022-07-09 LAB — GLUCOSE, CAPILLARY
Glucose-Capillary: 152 mg/dL — ABNORMAL HIGH (ref 70–99)
Glucose-Capillary: 153 mg/dL — ABNORMAL HIGH (ref 70–99)

## 2022-07-09 SURGERY — THYROIDECTOMY
Anesthesia: General | Site: Neck

## 2022-07-09 MED ORDER — LIDOCAINE HCL (PF) 2 % IJ SOLN
INTRAMUSCULAR | Status: AC
  Filled 2022-07-09: qty 5

## 2022-07-09 MED ORDER — CHLORHEXIDINE GLUCONATE CLOTH 2 % EX PADS: 6.0000 | MEDICATED_PAD | Freq: Once | CUTANEOUS | Status: DC

## 2022-07-09 MED ORDER — MEPERIDINE HCL 50 MG/ML IJ SOLN: 6.2500 mg | INTRAMUSCULAR | Status: DC | PRN

## 2022-07-09 MED ORDER — CHLORHEXIDINE GLUCONATE 0.12 % MT SOLN
15.0000 mL | Freq: Once | OROMUCOSAL | Status: AC
  Administered 2022-07-09: 15 mL via OROMUCOSAL

## 2022-07-09 MED ORDER — HYDROMORPHONE HCL 1 MG/ML IJ SOLN
1.0000 mg | INTRAMUSCULAR | Status: DC | PRN
  Administered 2022-07-09: 1 mg via INTRAVENOUS
  Filled 2022-07-09: qty 1

## 2022-07-09 MED ORDER — FENTANYL CITRATE (PF) 100 MCG/2ML IJ SOLN
INTRAMUSCULAR | Status: DC | PRN
  Administered 2022-07-09 (×2): 100 ug via INTRAVENOUS

## 2022-07-09 MED ORDER — LIDOCAINE 2% (20 MG/ML) 5 ML SYRINGE
INTRAMUSCULAR | Status: DC | PRN
  Administered 2022-07-09: 80 mg via INTRAVENOUS

## 2022-07-09 MED ORDER — PROPOFOL 10 MG/ML IV BOLUS
INTRAVENOUS | Status: DC | PRN
  Administered 2022-07-09: 150 mg via INTRAVENOUS

## 2022-07-09 MED ORDER — FENTANYL CITRATE PF 50 MCG/ML IJ SOSY
PREFILLED_SYRINGE | INTRAMUSCULAR | Status: AC
  Filled 2022-07-09: qty 1

## 2022-07-09 MED ORDER — LABETALOL HCL 5 MG/ML IV SOLN
INTRAVENOUS | Status: DC | PRN
  Administered 2022-07-09: 5 mg via INTRAVENOUS

## 2022-07-09 MED ORDER — OXYCODONE HCL 5 MG PO TABS: 5.0000 mg | ORAL_TABLET | Freq: Once | ORAL | Status: DC | PRN

## 2022-07-09 MED ORDER — ONDANSETRON 4 MG PO TBDP
4.0000 mg | ORAL_TABLET | Freq: Four times a day (QID) | ORAL | Status: DC | PRN
  Administered 2022-07-10 (×2): 4 mg via ORAL
  Filled 2022-07-09 (×2): qty 1

## 2022-07-09 MED ORDER — CALCIUM CARBONATE 1250 (500 CA) MG PO TABS
2.0000 | ORAL_TABLET | Freq: Three times a day (TID) | ORAL | Status: DC
  Administered 2022-07-09 – 2022-07-10 (×3): 2500 mg via ORAL
  Filled 2022-07-09 (×3): qty 2

## 2022-07-09 MED ORDER — EMPAGLIFLOZIN 25 MG PO TABS
25.0000 mg | ORAL_TABLET | Freq: Every day | ORAL | Status: DC
  Administered 2022-07-09 – 2022-07-10 (×2): 25 mg via ORAL
  Filled 2022-07-09 (×2): qty 1

## 2022-07-09 MED ORDER — ROCURONIUM BROMIDE 10 MG/ML (PF) SYRINGE
PREFILLED_SYRINGE | INTRAVENOUS | Status: DC | PRN
  Administered 2022-07-09: 60 mg via INTRAVENOUS

## 2022-07-09 MED ORDER — HEMOSTATIC AGENTS (NO CHARGE) OPTIME
TOPICAL | Status: DC | PRN
  Administered 2022-07-09: 1 via TOPICAL

## 2022-07-09 MED ORDER — CYCLOSPORINE 0.05 % OP EMUL
1.0000 [drp] | Freq: Two times a day (BID) | OPHTHALMIC | Status: DC
  Administered 2022-07-09 – 2022-07-10 (×2): 1 [drp] via OPHTHALMIC
  Filled 2022-07-09 (×2): qty 30

## 2022-07-09 MED ORDER — ONDANSETRON HCL 4 MG/2ML IJ SOLN: 4.0000 mg | Freq: Four times a day (QID) | INTRAMUSCULAR | Status: DC | PRN

## 2022-07-09 MED ORDER — TRAMADOL HCL 50 MG PO TABS: 50.0000 mg | ORAL_TABLET | Freq: Four times a day (QID) | ORAL | Status: DC | PRN

## 2022-07-09 MED ORDER — INSULIN ASPART 100 UNIT/ML IJ SOLN
0.0000 [IU] | Freq: Three times a day (TID) | INTRAMUSCULAR | Status: DC
  Administered 2022-07-09: 3 [IU] via SUBCUTANEOUS
  Administered 2022-07-10: 2 [IU] via SUBCUTANEOUS

## 2022-07-09 MED ORDER — DEXAMETHASONE SODIUM PHOSPHATE 4 MG/ML IJ SOLN
INTRAMUSCULAR | Status: DC | PRN
  Administered 2022-07-09: 5 mg via INTRAVENOUS

## 2022-07-09 MED ORDER — AMLODIPINE BESYLATE 5 MG PO TABS
5.0000 mg | ORAL_TABLET | Freq: Every day | ORAL | Status: DC
  Administered 2022-07-10: 5 mg via ORAL
  Filled 2022-07-09: qty 1

## 2022-07-09 MED ORDER — SODIUM CHLORIDE 0.45 % IV SOLN: INTRAVENOUS | Status: DC

## 2022-07-09 MED ORDER — OXYCODONE HCL 5 MG/5ML PO SOLN: 5.0000 mg | Freq: Once | ORAL | Status: DC | PRN

## 2022-07-09 MED ORDER — ACETAMINOPHEN 325 MG PO TABS: 650.0000 mg | ORAL_TABLET | Freq: Four times a day (QID) | ORAL | Status: DC | PRN

## 2022-07-09 MED ORDER — ACETAMINOPHEN 160 MG/5ML PO SOLN: 325.0000 mg | ORAL | Status: DC | PRN

## 2022-07-09 MED ORDER — LACTATED RINGERS IV SOLN: INTRAVENOUS | Status: DC

## 2022-07-09 MED ORDER — FENTANYL CITRATE (PF) 100 MCG/2ML IJ SOLN
INTRAMUSCULAR | Status: AC
  Filled 2022-07-09: qty 2

## 2022-07-09 MED ORDER — CEFAZOLIN SODIUM-DEXTROSE 2-4 GM/100ML-% IV SOLN
2.0000 g | INTRAVENOUS | Status: AC
  Administered 2022-07-09: 2 g via INTRAVENOUS

## 2022-07-09 MED ORDER — LABETALOL HCL 5 MG/ML IV SOLN
INTRAVENOUS | Status: AC
  Filled 2022-07-09: qty 4

## 2022-07-09 MED ORDER — ONDANSETRON HCL 4 MG/2ML IJ SOLN: 4.0000 mg | Freq: Once | INTRAMUSCULAR | Status: DC | PRN

## 2022-07-09 MED ORDER — MIDAZOLAM HCL 2 MG/2ML IJ SOLN
INTRAMUSCULAR | Status: AC
  Filled 2022-07-09: qty 2

## 2022-07-09 MED ORDER — MIDAZOLAM HCL 5 MG/5ML IJ SOLN
INTRAMUSCULAR | Status: DC | PRN
  Administered 2022-07-09: 2 mg via INTRAVENOUS

## 2022-07-09 MED ORDER — ACETAMINOPHEN 650 MG RE SUPP: 650.0000 mg | Freq: Four times a day (QID) | RECTAL | Status: DC | PRN

## 2022-07-09 MED ORDER — 0.9 % SODIUM CHLORIDE (POUR BTL) OPTIME
TOPICAL | Status: DC | PRN
  Administered 2022-07-09: 1000 mL

## 2022-07-09 MED ORDER — FENTANYL CITRATE PF 50 MCG/ML IJ SOSY
25.0000 ug | PREFILLED_SYRINGE | INTRAMUSCULAR | Status: DC | PRN
  Administered 2022-07-09 (×2): 50 ug via INTRAVENOUS

## 2022-07-09 MED ORDER — ONDANSETRON HCL 4 MG/2ML IJ SOLN
INTRAMUSCULAR | Status: DC | PRN
  Administered 2022-07-09: 4 mg via INTRAVENOUS

## 2022-07-09 MED ORDER — ORAL CARE MOUTH RINSE: 15.0000 mL | Freq: Once | OROMUCOSAL | Status: AC

## 2022-07-09 MED ORDER — OXYCODONE HCL 5 MG PO TABS
5.0000 mg | ORAL_TABLET | ORAL | Status: DC | PRN
  Administered 2022-07-09 – 2022-07-10 (×3): 10 mg via ORAL
  Filled 2022-07-09 (×3): qty 2

## 2022-07-09 MED ORDER — PROPOFOL 10 MG/ML IV BOLUS
INTRAVENOUS | Status: AC
  Filled 2022-07-09: qty 20

## 2022-07-09 MED ORDER — ONDANSETRON HCL 4 MG/2ML IJ SOLN
INTRAMUSCULAR | Status: AC
  Filled 2022-07-09: qty 2

## 2022-07-09 MED ORDER — ROCURONIUM BROMIDE 10 MG/ML (PF) SYRINGE
PREFILLED_SYRINGE | INTRAVENOUS | Status: AC
  Filled 2022-07-09: qty 10

## 2022-07-09 MED ORDER — SUGAMMADEX SODIUM 200 MG/2ML IV SOLN
INTRAVENOUS | Status: DC | PRN
  Administered 2022-07-09: 200 mg via INTRAVENOUS

## 2022-07-09 MED ORDER — ACETAMINOPHEN 325 MG PO TABS: 325.0000 mg | ORAL_TABLET | ORAL | Status: DC | PRN

## 2022-07-09 MED ORDER — LOSARTAN POTASSIUM 25 MG PO TABS
25.0000 mg | ORAL_TABLET | Freq: Every day | ORAL | Status: DC
  Administered 2022-07-09 – 2022-07-10 (×2): 25 mg via ORAL
  Filled 2022-07-09 (×2): qty 1

## 2022-07-09 MED ORDER — DEXAMETHASONE SODIUM PHOSPHATE 10 MG/ML IJ SOLN
INTRAMUSCULAR | Status: AC
  Filled 2022-07-09: qty 1

## 2022-07-09 SURGICAL SUPPLY — 32 items
ATTRACTOMAT 16X20 MAGNETIC DRP (DRAPES) ×1 IMPLANT
BAG COUNTER SPONGE SURGICOUNT (BAG) ×1 IMPLANT
BLADE SURG 15 STRL LF DISP TIS (BLADE) ×1 IMPLANT
BLADE SURG 15 STRL SS (BLADE) ×1
CHLORAPREP W/TINT 26 (MISCELLANEOUS) ×1 IMPLANT
CLIP TI MEDIUM 6 (CLIP) ×2 IMPLANT
CLIP TI WIDE RED SMALL 6 (CLIP) ×2 IMPLANT
CLSR STERI-STRIP ANTIMIC 1/2X4 (GAUZE/BANDAGES/DRESSINGS) IMPLANT
COVER SURGICAL LIGHT HANDLE (MISCELLANEOUS) ×1 IMPLANT
DERMABOND ADVANCED .7 DNX12 (GAUZE/BANDAGES/DRESSINGS) ×1 IMPLANT
DRAPE LAPAROTOMY T 98X78 PEDS (DRAPES) ×1 IMPLANT
DRAPE UTILITY XL STRL (DRAPES) ×1 IMPLANT
ELECT PENCIL ROCKER SW 15FT (MISCELLANEOUS) ×1 IMPLANT
ELECT REM PT RETURN 15FT ADLT (MISCELLANEOUS) ×1 IMPLANT
GAUZE 4X4 16PLY ~~LOC~~+RFID DBL (SPONGE) ×1 IMPLANT
GAUZE SPONGE 4X4 12PLY STRL (GAUZE/BANDAGES/DRESSINGS) IMPLANT
GLOVE SURG ORTHO 8.0 STRL STRW (GLOVE) ×1 IMPLANT
GOWN STRL REUS W/ TWL XL LVL3 (GOWN DISPOSABLE) ×2 IMPLANT
GOWN STRL REUS W/TWL XL LVL3 (GOWN DISPOSABLE) ×2
HEMOSTAT SURGICEL 2X4 FIBR (HEMOSTASIS) ×1 IMPLANT
ILLUMINATOR WAVEGUIDE N/F (MISCELLANEOUS) ×1 IMPLANT
KIT BASIN OR (CUSTOM PROCEDURE TRAY) ×1 IMPLANT
KIT TURNOVER KIT A (KITS) IMPLANT
PACK BASIC VI WITH GOWN DISP (CUSTOM PROCEDURE TRAY) ×1 IMPLANT
SHEARS HARMONIC 9CM CVD (BLADE) ×1 IMPLANT
SUT MNCRL AB 4-0 PS2 18 (SUTURE) ×1 IMPLANT
SUT VIC AB 3-0 SH 18 (SUTURE) ×2 IMPLANT
SYR BULB IRRIG 60ML STRL (SYRINGE) ×1 IMPLANT
TAPE CLOTH 4X10 WHT NS (GAUZE/BANDAGES/DRESSINGS) IMPLANT
TOWEL OR 17X26 10 PK STRL BLUE (TOWEL DISPOSABLE) ×1 IMPLANT
TOWEL OR NON WOVEN STRL DISP B (DISPOSABLE) ×1 IMPLANT
TUBING CONNECTING 10 (TUBING) ×1 IMPLANT

## 2022-07-09 NOTE — Interval H&P Note (Signed)
History and Physical Interval Note:  07/09/2022 8:52 AM  Kathryn Sexton  has presented today for surgery, with the diagnosis of PAPILLARY THYROID CARCINOMA, MULTIPLE THYROID NODULES.  The various methods of treatment have been discussed with the patient and family. After consideration of risks, benefits and other options for treatment, the patient has consented to    Procedure(s): TOTAL THYROIDECTOMY WITH LIMITED LYMPH NODE DISSECTION (N/A) as a surgical intervention.    The patient's history has been reviewed, patient examined, no change in status, stable for surgery.  I have reviewed the patient's chart and labs.  Questions were answered to the patient's satisfaction.    Armandina Gemma, [facility] [place] practice Office: [place]

## 2022-07-09 NOTE — Transfer of Care (Signed)
Immediate Anesthesia Transfer of Care Note  Patient: Kathryn Sexton  Procedure(s) Performed: TOTAL THYROIDECTOMY WITH LIMITED LYMPH NODE DISSECTION (Neck)  Patient Location: PACU  Anesthesia Type:General  Level of Consciousness: drowsy and patient cooperative  Airway & Oxygen Therapy: Patient Spontanous Breathing and Patient connected to face mask  Post-op Assessment: Report given to RN and Post -op Vital signs reviewed and stable  Post vital signs: Reviewed and stable  Last Vitals:  Vitals Value Taken Time  BP 136/93 07/09/22 1101  Temp    Pulse 86 07/09/22 1104  Resp 28 07/09/22 1104  SpO2 96 % 07/09/22 1104  Vitals shown include unvalidated device data.  Last Pain:  Vitals:   07/09/22 0758  TempSrc:   PainSc: 0-No pain         Complications: No notable events documented.

## 2022-07-09 NOTE — Anesthesia Postprocedure Evaluation (Signed)
Anesthesia Post Note  Patient: Kathryn Sexton  Procedure(s) Performed: TOTAL THYROIDECTOMY WITH LIMITED LYMPH NODE DISSECTION (Neck)     Patient location during evaluation: PACU Anesthesia Type: General Level of consciousness: awake and alert Pain management: pain level controlled Vital Signs Assessment: post-procedure vital signs reviewed and stable Respiratory status: spontaneous breathing, nonlabored ventilation, respiratory function stable and patient connected to nasal cannula oxygen Cardiovascular status: blood pressure returned to baseline and stable Postop Assessment: no apparent nausea or vomiting Anesthetic complications: no   No notable events documented.  Last Vitals:  Vitals:   07/09/22 1426 07/09/22 1532  BP: (!) 144/86 134/84  Pulse: 90 84  Resp: 18 14  Temp: 36.4 C 36.7 C  SpO2: 100% 97%    Last Pain:  Vitals:   07/09/22 1426  TempSrc: Oral  PainSc:                  [name]

## 2022-07-09 NOTE — Anesthesia Procedure Notes (Signed)
Procedure Name: Intubation Date/Time: 07/09/2022 9:21 AM  Performed by: Claudia Desanctis, CRNAPre-anesthesia Checklist: Patient identified, Emergency Drugs available, Suction available and Patient being monitored Patient Re-evaluated:Patient Re-evaluated prior to induction Oxygen Delivery Method: Circle system utilized Preoxygenation: Pre-oxygenation with 100% oxygen Induction Type: IV induction Ventilation: Mask ventilation without difficulty Laryngoscope Size: 2 and Miller Grade View: Grade I Tube type: Oral Tube size: 7.0 mm Number of attempts: 1 Airway Equipment and Method: Stylet Placement Confirmation: ETT inserted through vocal cords under direct vision, positive ETCO2 and breath sounds checked- equal and bilateral Secured at: 21 cm Tube secured with: Tape Dental Injury: Teeth and Oropharynx as per pre-operative assessment

## 2022-07-09 NOTE — Op Note (Signed)
Procedure Note  Pre-operative Diagnosis:  papillary thyroid carcinoma, multiple thyroid nodules  Post-operative Diagnosis:  same  Surgeon:  Armandina Gemma, MD  Assistant:  none   Procedure:  Total thyroidectomy with limited central compartment lymph node dissection  Anesthesia:  General  Estimated Blood Loss:  minimal  Drains: none         Specimen: thyroid to pathology  Indications:  Patient is referred by her primary care physician, Dr. Lujean Amel, for surgical evaluation and management of newly diagnosed papillary thyroid carcinoma. Patient had been seen for routine physical examination this past summer. Following her examination she noted a swelling or mass in the anterior neck. She contacted her primary care physician and was sent for an ultrasound on April 17, 2022. This demonstrated a normal-sized thyroid gland containing bilateral thyroid nodules. There was a highly suspicious mass in the isthmus measuring 1.4 cm. It appeared to have extrathyroidal extension. Fine-needle aspiration biopsy was recommended. This was performed on May 22, 2022. It showed findings consistent with papillary thyroid carcinoma, Bethesda category VI. Patient is now referred for surgical consultation for management of papillary thyroid carcinoma. Patient has no prior history of thyroid disease. She has never been on thyroid medication. She has had no prior head or neck surgery. There is a family history of thyroid disease in the patient's mother who underwent thyroidectomy, presumably for goiter. Patient is a Art gallery manager at [employer].  Procedure Details: Procedure was done in OR #5 at the [facility]. The patient was brought to the operating room and placed in a supine position on the operating room table. Following administration of general anesthesia, the patient was positioned and then prepped and draped in the usual aseptic fashion. After ascertaining that an adequate level of  anesthesia had been achieved, a small Kocher incision was made with #15 blade. Dissection was carried through subcutaneous tissues and platysma.Hemostasis was achieved with the electrocautery. Skin flaps were elevated cephalad and caudad from the thyroid notch to the sternal notch. A Mahorner self-retaining retractor was placed for exposure. Strap muscles were incised in the midline and dissection was begun on the left side.  Strap muscles were reflected laterally.  Left thyroid lobe was normal in size with small nodules.  The left lobe was gently mobilized with blunt dissection. Superior pole vessels were dissected out and divided individually between small and medium ligaclips with the harmonic scalpel. The thyroid lobe was rolled anteriorly. Branches of the inferior thyroid artery were divided between small ligaclips with the harmonic scalpel. Inferior venous tributaries were divided between ligaclips. Both the superior and inferior parathyroid glands were identified and preserved on their vascular pedicles. The recurrent laryngeal nerve was identified and preserved along its course. The ligament of Gwenlyn Found was released with the electrocautery and the gland was mobilized onto the anterior trachea. Isthmus was mobilized across the midline. There was a small pyramidal lobe present which was resected with the isthmus. Dry pack was placed in the left neck.  The right thyroid lobe was gently mobilized with blunt dissection. Right thyroid lobe was mildly enlarged with multiple nodules. Superior pole vessels were dissected out and divided between small and medium ligaclips with the Harmonic scalpel. Superior parathyroid was identified and preserved. Inferior venous tributaries were divided between medium ligaclips with the harmonic scalpel. The right thyroid lobe was rolled anteriorly and the branches of the inferior thyroid artery divided between small ligaclips. The right recurrent laryngeal nerve was identified and  preserved along its course.  The ligament of Gwenlyn Found was released with the electrocautery. The right thyroid lobe was mobilized onto the anterior trachea and the remainder of the thyroid was dissected off the anterior trachea and the thyroid was completely excised. A suture was used to mark the right lobe. The entire thyroid gland was submitted to pathology for review.  Tissue from the central compartment was dissected out taking care to avoid the underlying recurrent nerves and inferior parathyroid tissue.  Tissue was taken from the right carotid artery inferiorly to the innominate and to the left carotid artery.  Tissue was from anterior to the trachea.  No gross lymphadenopathy was identified.  Tissue was submitted to pathology for review.  Hemostasis was achieved with medium ligaclips and the electrocautery.  The neck was irrigated with warm saline. Fibrillar was placed throughout the operative field. Strap muscles were approximated in the midline with interrupted 3-0 Vicryl sutures. Platysma was closed with interrupted 3-0 Vicryl sutures. Skin was closed with a running 4-0 Monocryl subcuticular suture. Wound was washed and dried and Steri-strips were applied. The patient was awakened from anesthesia and brought to the recovery room. The patient tolerated the procedure well.   Armandina Gemma, [place] [facility] Office: [phone]

## 2022-07-10 ENCOUNTER — Encounter (HOSPITAL_COMMUNITY): Payer: Self-pay | Admitting: Surgery

## 2022-07-10 ENCOUNTER — Other Ambulatory Visit (HOSPITAL_COMMUNITY): Payer: Self-pay

## 2022-07-10 DIAGNOSIS — C73 Malignant neoplasm of thyroid gland: Secondary | ICD-10-CM | POA: Diagnosis not present

## 2022-07-10 LAB — GLUCOSE, CAPILLARY
Glucose-Capillary: 188 mg/dL — ABNORMAL HIGH (ref 70–99)
Glucose-Capillary: 149 mg/dL — ABNORMAL HIGH (ref 70–99)
Glucose-Capillary: 125 mg/dL — ABNORMAL HIGH (ref 70–99)

## 2022-07-10 LAB — BASIC METABOLIC PANEL
Anion gap: 11 (ref 5–15)
BUN: 12 mg/dL (ref 6–20)
CO2: 24 mmol/L (ref 22–32)
Calcium: 8.7 mg/dL — ABNORMAL LOW (ref 8.9–10.3)
Chloride: 102 mmol/L (ref 98–111)
Creatinine, Ser: 0.55 mg/dL (ref 0.44–1.00)
GFR, Estimated: >60 mL/min (ref >60)
Glucose, Bld: 140 mg/dL — ABNORMAL HIGH (ref 70–99)
Potassium: 3.8 mmol/L (ref 3.5–5.1)
Sodium: 137 mmol/L (ref 135–145)

## 2022-07-10 MED ORDER — ONDANSETRON HCL 4 MG PO TABS
4.0000 mg | ORAL_TABLET | Freq: Three times a day (TID) | ORAL | 0 refills | Status: AC | PRN
  Filled 2022-07-10: qty 10, 4d supply, fill #0

## 2022-07-10 MED ORDER — CALCIUM CARBONATE ANTACID 500 MG PO CHEW
2.0000 | CHEWABLE_TABLET | Freq: Two times a day (BID) | ORAL | 1 refills | Status: AC
  Filled 2022-07-10: qty 90, 23d supply, fill #0

## 2022-07-10 MED ORDER — TRAMADOL HCL 50 MG PO TABS
50.0000 mg | ORAL_TABLET | Freq: Four times a day (QID) | ORAL | 0 refills | Status: AC | PRN
  Filled 2022-07-10: qty 15, 2d supply, fill #0

## 2022-07-10 MED ORDER — LEVOTHYROXINE SODIUM 100 MCG PO TABS
100.0000 ug | ORAL_TABLET | Freq: Every day | ORAL | 3 refills | Status: AC
  Filled 2022-07-10: qty 30, 30d supply, fill #0
  Filled 2022-07-31: qty 30, 30d supply, fill #1
  Filled 2022-08-03: qty 90, 90d supply, fill #1

## 2022-07-10 NOTE — Discharge Summary (Signed)
Physician Discharge Summary   Patient ID: Kathryn Sexton MRN: [id number] DOB/AGE: [birth year] 51 y.o.  Admit date: 07/09/2022  Discharge date: 07/10/2022  Discharge Diagnoses:  Principal Problem:   Papillary thyroid carcinoma [facility]) Active Problems:   Multiple thyroid nodules   Discharged Condition: good  Hospital Course: Patient was admitted for observation following thyroid surgery.  Post op course was uncomplicated.  Pain was well controlled.  Tolerated diet.  Post op calcium level on morning following surgery was 8.7 mg/dl.  Patient was prepared for discharge home on POD#1.  Consults: None  Treatments: surgery: total thyroidectomy with limited lymph node dissection  Discharge Exam: Blood pressure 125/81, pulse 98, temperature 97.9 F (36.6 C), resp. rate 18, height '5\' 5"'$  (1.651 m), weight 77.1 kg, last menstrual period 03/28/2020, SpO2 99 %. HEENT - clear Neck - wound dry and intact; mild STS; voice normal; steri-strips in place  Disposition: Home  Discharge Instructions     Diet - low sodium heart healthy   Complete by: As directed    Increase activity slowly   Complete by: As directed    No dressing needed   Complete by: As directed       Allergies as of 07/10/2022       Reactions   Metformin Itching   Other    Surgical glue: Rash Itching        Medication List     TAKE these medications    amLODipine 5 MG tablet Commonly known as: NORVASC TAKE 1 TABLET BY MOUTH ONCE DAILY.   atorvastatin 20 MG tablet Commonly known as: LIPITOR Take 1 tablet by mouth once daily.   calcium carbonate 500 MG chewable tablet Commonly known as: Tums Chew 2 tablets (400 mg of elemental calcium total) by mouth 2 (two) times daily.   CENTRUM PO Take 1 tablet by mouth daily.   cholecalciferol 1000 units tablet Commonly known as: VITAMIN D Take 1,000 Units by mouth at bedtime.   freestyle lancets Use as directed to check blood glucose once daily.    FREESTYLE LITE test strip Generic drug: glucose blood Use as directed to check bl0od glucose once daily.   hydrochlorothiazide 25 MG tablet Commonly known as: HYDRODIURIL TAKE 1 TABLET BY MOUTH ONCE DAILY IN THE MORNING.   Jardiance 25 MG Tabs tablet Generic drug: empagliflozin TAKE 1 TABLET BY MOUTH DAILY   levothyroxine 100 MCG tablet Commonly known as: Synthroid Take 1 tablet (100 mcg total) by mouth daily before breakfast.   losartan 25 MG tablet Commonly known as: COZAAR Take 1 tablet by mouth once a day   OMEGA 3 PO Take 1 capsule by mouth daily.   ondansetron 4 MG tablet Commonly known as: Zofran Take 1 tablet (4 mg total) by mouth every 8 (eight) hours as needed for nausea or vomiting.   Paxlovid (300/100) 20 x 150 MG & 10 x '100MG'$  Tbpk Generic drug: nirmatrelvir & ritonavir Take 3 tablets by mouth twice daily for 5 days   Potassium 99 MG Tabs Take 99 mg by mouth daily.   Restasis 0.05 % ophthalmic emulsion Generic drug: cycloSPORINE Instill 1 drop into both eyes twice a day as directed   cycloSPORINE 0.05 % ophthalmic emulsion Commonly known as: Restasis Instill 1 drop into both eyes twice a day as directed   Systane Complete 0.6 % Soln Generic drug: Propylene Glycol Place 1 drop into both eyes 3 (three) times daily as needed (dry/irritated eyes.).   traMADol 50 MG tablet Commonly  known as: ULTRAM Take 1-2 tablets (50-100 mg total) by mouth every 6 (six) hours as needed for moderate pain.               Discharge Care Instructions  (From admission, onward)           Start     Ordered   07/10/22 0000  No dressing needed        07/10/22 1043            Follow-up Information     Armandina Gemma, MD. Schedule an appointment as soon as possible for a visit in 3 week(s).   Specialty: General Surgery Why: For wound re-check Contact information: [place] [address] [phone]                 Kelcie Currie, [place] [facility] Office: [phone]   Signed: Armandina Gemma 07/10/2022, 10:43 AM

## 2022-07-10 NOTE — Discharge Instructions (Signed)
[facility] - Dr. Cosima Prentiss  THYROID & PARATHYROID SURGERY:  POST-OP INSTRUCTIONS  Always review the instruction sheet provided by the hospital nurse at discharge.  A prescription for pain medication may be sent to your pharmacy at the time of discharge.  Take your pain medication as prescribed.  If narcotic pain medicine is not needed, then you may take acetaminophen (Tylenol) or ibuprofen (Advil) as needed for pain or soreness.  Take your normal home medications as prescribed unless otherwise directed.  If you need a refill on your pain medication, please contact the office during regular business hours.  Prescriptions will not be processed by the office after 5:00PM or on weekends.  Start with a light diet upon arrival home, such as soup and crackers or toast.  Be sure to drink plenty of fluids.  Resume your normal diet the day after surgery.  Most patients will experience some swelling and bruising on the chest and neck area.  Ice packs will help for the first 48 hours after arriving home.  Swelling and bruising will take several days to resolve.   It is common to experience some constipation after surgery.  Increasing fluid intake and taking a stool softener (Colace) will usually help to prevent this problem.  A mild laxative (Milk of Magnesia or Miralax) should be taken according to package directions if there has been no bowel movement after 48 hours.  Dermabond glue covers your incision. This seals the wound and you may shower at any time. The Dermabond will remain in place for about a week.  You may gradually remove the glue when it loosens around the edges.  If you need to loosen the Dermabond for removal, apply a layer of Vaseline to the wound for 15 minutes and then remove with a Kleenex. Your sutures are under the skin and will not show - they will dissolve on their own.  You may resume light daily activities beginning the day after discharge (such as self-care,  walking, climbing stairs), gradually increasing activities as tolerated. You may have sexual intercourse when it is comfortable. Refrain from any heavy lifting or straining until approved by your doctor. You may drive when you no longer are taking prescription pain medication, you can comfortably wear a seatbelt, and you can safely maneuver your car and apply the brakes.  You will see your doctor in the office for a follow-up appointment approximately three weeks after your surgery.  Make sure that you call for this appointment within a day or two after you arrive home to insure a convenient appointment time. Please have any requested laboratory tests performed a few days prior to your office visit so that the results will be available at your follow up appointment.  WHEN TO CALL THE [facility] OFFICE: -- Fever greater than 101.5 -- Inability to urinate -- Nausea and/or vomiting - persistent -- Extreme swelling or bruising -- Continued bleeding from incision -- Increased pain, redness, or drainage from the incision -- Difficulty swallowing or breathing -- Muscle cramping or spasms -- Numbness or tingling in hands or around lips  The clinic staff is available to answer your questions during regular business hours.  Please don't hesitate to call and ask to speak to one of the nurses if you have concerns.  [facility] OFFICE: [phone] (24 hours)  Please sign up for MyChart accounts. This will allow you to communicate directly with my nurse or myself without having to call the office. It will also allow you   to view your test results. You will need to enroll in MyChart for my office ([facility]) and for the hospital ([place]).  Makaylee Spielberg, MD [facility] practice 

## 2022-07-10 NOTE — Progress Notes (Signed)
Discharge instructions discussed with patient and family, verbalized agreement and understanding 

## 2022-07-13 LAB — SURGICAL PATHOLOGY

## 2022-07-14 NOTE — Progress Notes (Signed)
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  All lymph nodes negative.  Will arrange consult with Dr. Dagmar Hait from [facility].  Will have written path report for patient at post op visit in office.  Claiborne Billings - please send referral to Dr. Cindra Eves office.  Hos[place], MD [facility] [place] practice Office: [phone]

## 2022-07-31 ENCOUNTER — Other Ambulatory Visit (HOSPITAL_COMMUNITY): Payer: Self-pay

## 2022-08-03 ENCOUNTER — Other Ambulatory Visit (HOSPITAL_COMMUNITY): Payer: Self-pay

## 2022-08-03 MED ORDER — GLUCOSE BLOOD VI STRP
ORAL_STRIP | 3 refills | Status: AC
  Filled 2022-08-03: qty 50, 50d supply, fill #0
  Filled 2023-07-08: qty 50, 30d supply, fill #1

## 2022-08-03 MED ORDER — ONETOUCH DELICA LANCETS 30G MISC
3 refills | Status: AC
  Filled 2022-08-03: qty 100, 90d supply, fill #0

## 2022-08-04 ENCOUNTER — Other Ambulatory Visit (HOSPITAL_COMMUNITY): Payer: Self-pay

## 2022-08-05 ENCOUNTER — Other Ambulatory Visit (HOSPITAL_COMMUNITY): Payer: Self-pay

## 2022-08-06 ENCOUNTER — Other Ambulatory Visit (HOSPITAL_COMMUNITY): Payer: Self-pay

## 2022-08-06 MED ORDER — FARXIGA 10 MG PO TABS
10.0000 mg | ORAL_TABLET | Freq: Every day | ORAL | 3 refills | Status: AC
  Filled 2022-08-06: qty 90, 90d supply, fill #0
  Filled 2022-11-11: qty 90, 90d supply, fill #1

## 2022-08-11 ENCOUNTER — Other Ambulatory Visit (HOSPITAL_COMMUNITY): Payer: Self-pay

## 2022-08-12 ENCOUNTER — Other Ambulatory Visit (HOSPITAL_COMMUNITY): Payer: Self-pay

## 2022-08-13 ENCOUNTER — Other Ambulatory Visit (HOSPITAL_COMMUNITY): Payer: Self-pay

## 2022-08-19 ENCOUNTER — Other Ambulatory Visit (HOSPITAL_COMMUNITY): Payer: Self-pay

## 2022-08-19 MED ORDER — ONETOUCH ULTRA VI LIQD
0 refills | Status: AC
  Filled 2022-08-19: qty 1, 30d supply, fill #0

## 2022-08-24 ENCOUNTER — Other Ambulatory Visit (HOSPITAL_COMMUNITY): Payer: Self-pay

## 2022-08-25 ENCOUNTER — Other Ambulatory Visit: Payer: Self-pay | Admitting: Internal Medicine

## 2022-08-25 DIAGNOSIS — C73 Malignant neoplasm of thyroid gland: Secondary | ICD-10-CM

## 2022-08-26 ENCOUNTER — Other Ambulatory Visit (HOSPITAL_COMMUNITY): Payer: Self-pay

## 2022-08-26 MED ORDER — LEVOTHYROXINE SODIUM 125 MCG PO TABS
125.0000 ug | ORAL_TABLET | Freq: Every morning | ORAL | 11 refills | Status: AC
  Filled 2022-08-26: qty 30, 30d supply, fill #0
  Filled 2022-09-22: qty 90, 90d supply, fill #1
  Filled 2022-11-11: qty 90, 90d supply, fill #2

## 2022-09-07 ENCOUNTER — Ambulatory Visit
Admission: RE | Admit: 2022-09-07 | Discharge: 2022-09-07 | Disposition: A | Discharge status: Home / Self Care | Payer: Commercial Managed Care - HMO | Source: Ambulatory Visit | Attending: Internal Medicine | Admitting: Internal Medicine

## 2022-09-07 DIAGNOSIS — C73 Malignant neoplasm of thyroid gland: Secondary | ICD-10-CM

## 2022-09-22 ENCOUNTER — Other Ambulatory Visit (HOSPITAL_COMMUNITY): Payer: Self-pay

## 2022-09-26 ENCOUNTER — Other Ambulatory Visit (HOSPITAL_COMMUNITY): Payer: Self-pay

## 2022-11-05 ENCOUNTER — Other Ambulatory Visit (HOSPITAL_COMMUNITY): Payer: Self-pay

## 2022-11-11 ENCOUNTER — Other Ambulatory Visit (HOSPITAL_COMMUNITY): Payer: Self-pay

## 2022-11-12 ENCOUNTER — Other Ambulatory Visit (HOSPITAL_COMMUNITY): Payer: Self-pay

## 2022-11-13 ENCOUNTER — Other Ambulatory Visit (HOSPITAL_COMMUNITY): Payer: Self-pay

## 2022-11-16 ENCOUNTER — Other Ambulatory Visit (HOSPITAL_COMMUNITY): Payer: Self-pay

## 2022-11-18 ENCOUNTER — Other Ambulatory Visit (HOSPITAL_COMMUNITY): Payer: Self-pay

## 2022-11-18 MED ORDER — JARDIANCE 10 MG PO TABS
10.0000 mg | ORAL_TABLET | Freq: Every morning | ORAL | 5 refills | Status: AC
  Filled 2022-11-18 – 2022-11-19 (×3): qty 30, 30d supply, fill #0

## 2022-11-19 ENCOUNTER — Other Ambulatory Visit (HOSPITAL_COMMUNITY): Payer: Self-pay

## 2022-11-19 MED ORDER — JARDIANCE 10 MG PO TABS: 10.0000 mg | ORAL_TABLET | Freq: Every morning | ORAL | 5 refills | Status: AC

## 2022-11-19 MED ORDER — JARDIANCE 10 MG PO TABS: 10.0000 mg | ORAL_TABLET | Freq: Every day | ORAL | 3 refills | Status: AC

## 2022-11-21 ENCOUNTER — Other Ambulatory Visit (HOSPITAL_COMMUNITY): Payer: Self-pay

## 2022-12-10 ENCOUNTER — Other Ambulatory Visit (HOSPITAL_COMMUNITY): Payer: Self-pay

## 2022-12-10 MED ORDER — JARDIANCE 25 MG PO TABS
ORAL_TABLET | ORAL | 3 refills | Status: DC
  Filled 2022-12-10: qty 90, 90d supply, fill #0
  Filled 2023-03-03: qty 90, 90d supply, fill #1
  Filled 2023-06-04: qty 90, 90d supply, fill #2
  Filled 2023-09-02: qty 90, 90d supply, fill #3

## 2022-12-17 ENCOUNTER — Other Ambulatory Visit (HOSPITAL_COMMUNITY): Payer: Self-pay

## 2023-01-25 ENCOUNTER — Other Ambulatory Visit: Payer: Self-pay | Admitting: Family Medicine

## 2023-01-25 DIAGNOSIS — Z1231 Encounter for screening mammogram for malignant neoplasm of breast: Secondary | ICD-10-CM

## 2023-01-29 ENCOUNTER — Ambulatory Visit
Admission: RE | Admit: 2023-01-29 | Discharge: 2023-01-29 | Disposition: A | Discharge status: Home / Self Care | Payer: Medicaid Other | Source: Ambulatory Visit | Attending: Family Medicine | Admitting: Family Medicine

## 2023-01-29 DIAGNOSIS — Z1231 Encounter for screening mammogram for malignant neoplasm of breast: Secondary | ICD-10-CM

## 2023-02-12 ENCOUNTER — Other Ambulatory Visit: Payer: Self-pay

## 2023-03-03 ENCOUNTER — Other Ambulatory Visit (HOSPITAL_COMMUNITY): Payer: Self-pay

## 2023-03-03 MED ORDER — LEVOTHYROXINE SODIUM 137 MCG PO TABS
ORAL_TABLET | ORAL | 4 refills | Status: AC
  Filled 2023-03-03: qty 90, 90d supply, fill #0
  Filled 2023-06-04: qty 90, 90d supply, fill #1
  Filled 2023-09-02: qty 90, 90d supply, fill #2
  Filled 2023-11-26 (×2): qty 90, 90d supply, fill #3
  Filled 2024-02-09: qty 90, 90d supply, fill #4

## 2023-03-12 ENCOUNTER — Other Ambulatory Visit (HOSPITAL_COMMUNITY): Payer: Self-pay

## 2023-03-12 MED ORDER — XIIDRA 5 % OP SOLN
1.0000 [drp] | Freq: Two times a day (BID) | OPHTHALMIC | 3 refills | Status: AC
  Filled 2023-03-12: qty 60, 30d supply, fill #0

## 2023-03-12 MED ORDER — CEQUA 0.09 % OP SOLN
1.0000 [drp] | Freq: Two times a day (BID) | OPHTHALMIC | 3 refills | Status: DC
  Filled 2023-03-12: qty 60, 30d supply, fill #0

## 2023-03-12 MED ORDER — EYSUVIS 0.25 % OP SUSP
1.0000 [drp] | Freq: Four times a day (QID) | OPHTHALMIC | 3 refills | Status: AC
  Filled 2023-03-12: qty 8.3, 21d supply, fill #0

## 2023-03-12 MED ORDER — CROMOLYN SODIUM 4 % OP SOLN
1.0000 [drp] | Freq: Two times a day (BID) | OPHTHALMIC | 3 refills | Status: AC
  Filled 2023-03-12: qty 10, 34d supply, fill #0

## 2023-03-15 ENCOUNTER — Other Ambulatory Visit (HOSPITAL_COMMUNITY): Payer: Self-pay

## 2023-03-16 ENCOUNTER — Other Ambulatory Visit (HOSPITAL_COMMUNITY): Payer: Self-pay

## 2023-03-17 ENCOUNTER — Other Ambulatory Visit (HOSPITAL_COMMUNITY): Payer: Self-pay

## 2023-04-16 ENCOUNTER — Other Ambulatory Visit (HOSPITAL_COMMUNITY): Payer: Self-pay

## 2023-04-16 MED ORDER — CEQUA 0.09 % OP SOLN
1.0000 [drp] | Freq: Two times a day (BID) | OPHTHALMIC | 3 refills | Status: DC
  Filled 2023-04-16: qty 60, 30d supply, fill #0
  Filled 2023-09-02: qty 60, 30d supply, fill #1
  Filled 2024-02-09: qty 60, 30d supply, fill #2

## 2023-04-19 ENCOUNTER — Other Ambulatory Visit (HOSPITAL_COMMUNITY): Payer: Self-pay

## 2023-04-19 MED ORDER — ATORVASTATIN CALCIUM 20 MG PO TABS
20.0000 mg | ORAL_TABLET | Freq: Every day | ORAL | 4 refills | Status: DC
  Filled 2023-04-19 – 2023-06-04 (×3): qty 90, 90d supply, fill #0
  Filled 2023-09-02: qty 90, 90d supply, fill #1
  Filled 2023-11-26 (×2): qty 90, 90d supply, fill #2
  Filled 2024-02-09: qty 90, 90d supply, fill #3

## 2023-04-19 MED ORDER — AMLODIPINE BESYLATE 5 MG PO TABS
5.0000 mg | ORAL_TABLET | Freq: Every day | ORAL | 4 refills | Status: AC
  Filled 2023-04-19: qty 90, 90d supply, fill #0

## 2023-04-19 MED ORDER — LOSARTAN POTASSIUM 25 MG PO TABS
25.0000 mg | ORAL_TABLET | Freq: Every day | ORAL | 11 refills | Status: AC
  Filled 2023-04-19: qty 30, 30d supply, fill #0
  Filled 2023-11-26: qty 30, 30d supply, fill #1

## 2023-04-21 ENCOUNTER — Other Ambulatory Visit (HOSPITAL_COMMUNITY): Payer: Self-pay

## 2023-04-22 ENCOUNTER — Other Ambulatory Visit: Payer: Self-pay

## 2023-04-22 ENCOUNTER — Other Ambulatory Visit (HOSPITAL_COMMUNITY): Payer: Self-pay

## 2023-04-24 ENCOUNTER — Other Ambulatory Visit (HOSPITAL_COMMUNITY): Payer: Self-pay

## 2023-04-26 ENCOUNTER — Other Ambulatory Visit (HOSPITAL_COMMUNITY): Payer: Self-pay

## 2023-04-26 MED ORDER — LOSARTAN POTASSIUM 25 MG PO TABS
25.0000 mg | ORAL_TABLET | Freq: Every day | ORAL | 1 refills | Status: DC
  Filled 2023-04-26 – 2023-06-04 (×3): qty 90, 90d supply, fill #0
  Filled 2023-09-02: qty 90, 90d supply, fill #1

## 2023-05-03 ENCOUNTER — Other Ambulatory Visit (HOSPITAL_COMMUNITY): Payer: Self-pay

## 2023-05-03 MED ORDER — OZEMPIC (0.25 OR 0.5 MG/DOSE) 2 MG/3ML ~~LOC~~ SOPN
0.2500 mg | PEN_INJECTOR | SUBCUTANEOUS | 1 refills | Status: DC
  Filled 2023-05-03: qty 3, 28d supply, fill #0
  Filled 2023-06-04: qty 3, 28d supply, fill #1

## 2023-05-05 ENCOUNTER — Other Ambulatory Visit (HOSPITAL_COMMUNITY): Payer: Self-pay

## 2023-05-06 ENCOUNTER — Other Ambulatory Visit: Payer: Self-pay

## 2023-05-06 ENCOUNTER — Other Ambulatory Visit (HOSPITAL_COMMUNITY): Payer: Self-pay

## 2023-05-06 MED ORDER — IVERMECTIN 1 % EX CREA
TOPICAL_CREAM | CUTANEOUS | 2 refills | Status: AC
  Filled 2023-05-06 (×3): qty 45, 30d supply, fill #0

## 2023-05-06 MED ORDER — DOXYCYCLINE HYCLATE 20 MG PO TABS
20.0000 mg | ORAL_TABLET | Freq: Two times a day (BID) | ORAL | 2 refills | Status: DC
  Filled 2023-05-06: qty 60, 30d supply, fill #0

## 2023-05-07 ENCOUNTER — Other Ambulatory Visit (HOSPITAL_COMMUNITY): Payer: Self-pay

## 2023-05-08 ENCOUNTER — Other Ambulatory Visit (HOSPITAL_COMMUNITY): Payer: Self-pay

## 2023-05-10 ENCOUNTER — Other Ambulatory Visit (HOSPITAL_COMMUNITY): Payer: Self-pay

## 2023-06-04 ENCOUNTER — Other Ambulatory Visit (HOSPITAL_COMMUNITY): Payer: Self-pay

## 2023-06-04 ENCOUNTER — Other Ambulatory Visit: Payer: Self-pay

## 2023-06-11 ENCOUNTER — Other Ambulatory Visit (HOSPITAL_COMMUNITY): Payer: Self-pay

## 2023-06-11 MED ORDER — OZEMPIC (0.25 OR 0.5 MG/DOSE) 2 MG/3ML ~~LOC~~ SOPN
0.2500 mg | PEN_INJECTOR | SUBCUTANEOUS | 1 refills | Status: AC
  Filled 2023-06-11: qty 9, 84d supply, fill #0
  Filled 2023-07-09: qty 3, 28d supply, fill #0
  Filled 2023-09-02: qty 3, 28d supply, fill #1
  Filled 2023-11-09: qty 3, 28d supply, fill #2
  Filled 2023-12-31: qty 3, 28d supply, fill #3
  Filled 2024-02-09: qty 9, 84d supply, fill #4
  Filled 2024-06-07: qty 3, 28d supply, fill #5

## 2023-07-08 ENCOUNTER — Other Ambulatory Visit (HOSPITAL_COMMUNITY): Payer: Self-pay

## 2023-07-09 ENCOUNTER — Other Ambulatory Visit (HOSPITAL_COMMUNITY): Payer: Self-pay

## 2023-07-09 ENCOUNTER — Ambulatory Visit: Payer: Medicaid Other | Admitting: Podiatry

## 2023-07-09 DIAGNOSIS — B351 Tinea unguium: Secondary | ICD-10-CM

## 2023-07-09 MED ORDER — CICLOPIROX 8 % EX SOLN
Freq: Every day | CUTANEOUS | 0 refills | Status: DC
  Filled 2023-07-09: qty 6.6, 21d supply, fill #0

## 2023-07-09 MED ORDER — BD PEN NEEDLE NANO U/F 32G X 4 MM MISC
1 refills | Status: AC
  Filled 2023-07-09: qty 30, 30d supply, fill #0
  Filled 2023-07-12: qty 100, 34d supply, fill #0
  Filled 2023-10-12: qty 100, 34d supply, fill #1

## 2023-07-09 MED ORDER — ACCU-CHEK GUIDE W/DEVICE KIT
PACK | 0 refills | Status: AC
  Filled 2023-07-09: qty 1, 1d supply, fill #0

## 2023-07-09 MED ORDER — ONETOUCH VERIO VI STRP
ORAL_STRIP | 3 refills | Status: AC
  Filled 2023-07-09: qty 50, 34d supply, fill #0

## 2023-07-09 NOTE — Progress Notes (Signed)
Subjective:  Patient ID: Kathryn Sexton, female    DOB: 1971/09/11,  MRN: [id number]  Chief Complaint  Patient presents with   Nail Problem    RIGHT HALLUX  DIABETIC, INJURED IT ABOUT 20-25 YEARS AGO AND IT HAS BEEN GROWING DIFFERENT SINCE, NOW THAT SHE IS A DIABETIC SHE WANTS IT TO BE LOOKED AT AND SHE DOESN'T WANT TO MESS WITH IT AND CAUSE AND INFECTION    52 y.o. female presents with the above complaint.  Patient presents with right hallux nail fungus with thickened elongated dystrophic mycotic nail.  She is a diabetic she wanted to discuss treatment options.  Denies any other acute complaints   Review of Systems: Negative except as noted in the HPI. Denies N/V/F/Ch.  Past Medical History:  Diagnosis Date   Diabetes mellitus without complication (HCC)    Type II   GERD (gastroesophageal reflux disease)    HLD (hyperlipidemia)    Hypertension    states BP changes, no medication   Uterine fibroid     Current Outpatient Medications:    ciclopirox (PENLAC) 8 % solution, Apply over nail and surrounding skin. Apply daily over previous coat. After seven (7) days, may remove with alcohol and continue cycle., Disp: 6.6 mL, Rfl: 0   amLODipine (NORVASC) 5 MG tablet, Take 1 tablet (5 mg total) by mouth daily., Disp: 90 tablet, Rfl: 4   atorvastatin (LIPITOR) 20 MG tablet, Take 1 tablet (20 mg total) by mouth daily., Disp: 90 tablet, Rfl: 4   Blood Glucose Calibration (ONETOUCH ULTRA) LIQD, Use as directed, Disp: 1 each, Rfl: 0   Blood Glucose Monitoring Suppl (ACCU-CHEK GUIDE) w/Device KIT, Use as directed to test blood glucose., Disp: 1 kit, Rfl: 0   calcium carbonate (TUMS) 500 MG chewable tablet, Chew 2 tablets (400 mg of elemental calcium total) by mouth 2 (two) times daily., Disp: 90 tablet, Rfl: 1   cholecalciferol (VITAMIN D) 1000 units tablet, Take 1,000 Units by mouth at bedtime. , Disp: , Rfl:    cromolyn (OPTICROM) 4 % ophthalmic solution, Place 1 drop into both eyes 2 (two)  times daily., Disp: 10 mL, Rfl: 3   cycloSPORINE (RESTASIS) 0.05 % ophthalmic emulsion, Instill 1 drop into both eyes twice a day as directed, Disp: 180 mL, Rfl: 3   cycloSPORINE, PF, (CEQUA) 0.09 % SOLN, Place 1 drop into both eyes 2 (two) times daily., Disp: 60 each, Rfl: 3   dapagliflozin propanediol (FARXIGA) 10 MG TABS tablet, Take 1 tablet (10 mg total) by mouth daily., Disp: 90 tablet, Rfl: 3   empagliflozin (JARDIANCE) 10 MG TABS tablet, Take 1 tablet (10 mg total) by mouth in the morning., Disp: 30 tablet, Rfl: 5   empagliflozin (JARDIANCE) 10 MG TABS tablet, Take 1 tablet (10 mg total) by mouth in the morning., Disp: 30 tablet, Rfl: 5   empagliflozin (JARDIANCE) 10 MG TABS tablet, Take 1 tablet (10 mg total) by mouth daily., Disp: 90 tablet, Rfl: 3   empagliflozin (JARDIANCE) 25 MG TABS tablet, TAKE 1 TABLET BY MOUTH DAILY, Disp: 90 tablet, Rfl: 4   empagliflozin (JARDIANCE) 25 MG TABS tablet, Take 1 tablet by mouth once daily, Disp: 90 tablet, Rfl: 3   glucose blood (FREESTYLE LITE) test strip, Use as directed to check bl0od glucose once daily., Disp: 100 strip, Rfl: 3   glucose blood (ONETOUCH VERIO) test strip, Use as directed to check blood glucose once daily., Disp: 100 strip, Rfl: 3   glucose blood test strip, Use to test  blood sugar once daily, Disp: 100 each, Rfl: 3   Insulin Pen Needle (BD PEN NEEDLE NANO U/F) 32G X 4 MM MISC, Use as directed daily., Disp: 100 each, Rfl: 1   Ivermectin (SOOLANTRA) 1 % CREA, Apply to face at bedtime, Disp: 45 g, Rfl: 2   Lancets (FREESTYLE) lancets, Use as directed to check blood glucose once daily., Disp: 100 each, Rfl: 3   levothyroxine (SYNTHROID) 100 MCG tablet, Take 1 tablet (100 mcg total) by mouth daily before breakfast., Disp: 30 tablet, Rfl: 3   levothyroxine (SYNTHROID) 125 MCG tablet, Take 1 tablet (125 mcg total) by mouth every morning on an empty stomach, Disp: 30 tablet, Rfl: 11   levothyroxine (SYNTHROID) 137 MCG tablet, Take 1  tablet by mouth in the morning on an empty stomach for 90 days., Disp: 90 tablet, Rfl: 4   Lifitegrast (XIIDRA) 5 % SOLN, Place 1 drop into both eyes 2 (two) times daily., Disp: 60 each, Rfl: 3   losartan (COZAAR) 25 MG tablet, Take 1 tablet by mouth once a day, Disp: 30 tablet, Rfl: 11   losartan (COZAAR) 25 MG tablet, Take 1 tablet (25 mg total) by mouth daily., Disp: 90 tablet, Rfl: 1   Loteprednol Etabonate (EYSUVIS) 0.25 % SUSP, Place 1 drop into both eyes 4 (four) times daily for up to 2 weeks., Disp: 8.3 mL, Rfl: 3   Multiple Vitamins-Minerals (CENTRUM PO), Take 1 tablet by mouth daily., Disp: , Rfl:    Omega-3 Fatty Acids (OMEGA 3 PO), Take 1 capsule by mouth daily., Disp: , Rfl:    ondansetron (ZOFRAN) 4 MG tablet, Take 1 tablet (4 mg total) by mouth every 8 (eight) hours as needed for nausea or vomiting., Disp: 10 tablet, Rfl: 0   OneTouch Delica Lancets 30G MISC, Use to test blood sugar once daily, Disp: 100 each, Rfl: 3   Potassium 99 MG TABS, Take 99 mg by mouth daily. , Disp: , Rfl:    Propylene Glycol (SYSTANE COMPLETE) 0.6 % SOLN, Place 1 drop into both eyes 3 (three) times daily as needed (dry/irritated eyes.)., Disp: , Rfl:    Semaglutide,0.25 or 0.5MG /DOS, (OZEMPIC, 0.25 OR 0.5 MG/DOSE,) 2 MG/3ML SOPN, Inject 0.25 mg into the skin once a week., Disp: 12 mL, Rfl: 1   traMADol (ULTRAM) 50 MG tablet, Take 1-2 tablets (50-100 mg total) by mouth every 6 (six) hours as needed for moderate pain., Disp: 15 tablet, Rfl: 0  Social History   Tobacco Use  Smoking Status Never  Smokeless Tobacco Never    Allergies  Allergen Reactions   Metformin Itching   Other     Surgical glue: Rash Itching   Objective:  There were no vitals filed for this visit. There is no height or weight on file to calculate BMI. Constitutional Well developed. Well nourished.  Vascular Dorsalis pedis pulses palpable bilaterally. Posterior tibial pulses palpable bilaterally. Capillary refill normal to all  digits.  No cyanosis or clubbing noted. Pedal hair growth normal.  Neurologic Normal speech. Oriented to person, place, and time. Epicritic sensation to light touch grossly present bilaterally.  Dermatologic Right hallux thickened elongated dystrophic mycotic nail x 1 No open wounds. No skin lesions.  Orthopedic: Minimal strength 5 out of 5   Radiographs: None Assessment:  No diagnosis found. Plan:  Patient was evaluated and treated and all questions answered.  Right hallux onychomycosis -Educated the patient on the etiology of onychomycosis and various treatment options associated with improving the fungal load.  I explained to the patient that there is 3 treatment options available to treat the onychomycosis including topical, p.o., laser treatment.  Patient elected to go topical medication with Penlac Penlac was sent to the pharmacy.  If any foot and ankle issues arise future she will come back to see me.  I have asked her to take it twice a day   No follow-ups on file.

## 2023-07-10 ENCOUNTER — Other Ambulatory Visit (HOSPITAL_COMMUNITY): Payer: Self-pay

## 2023-07-12 ENCOUNTER — Other Ambulatory Visit (HOSPITAL_COMMUNITY): Payer: Self-pay

## 2023-07-16 ENCOUNTER — Other Ambulatory Visit (HOSPITAL_COMMUNITY): Payer: Self-pay

## 2023-09-02 ENCOUNTER — Other Ambulatory Visit (HOSPITAL_COMMUNITY): Payer: Self-pay

## 2023-10-12 ENCOUNTER — Other Ambulatory Visit: Payer: Self-pay

## 2023-11-09 ENCOUNTER — Other Ambulatory Visit (HOSPITAL_COMMUNITY): Payer: Self-pay

## 2023-11-26 ENCOUNTER — Other Ambulatory Visit (HOSPITAL_COMMUNITY): Payer: Self-pay

## 2023-11-26 MED ORDER — JARDIANCE 25 MG PO TABS
25.0000 mg | ORAL_TABLET | Freq: Every day | ORAL | 3 refills | Status: DC
  Filled 2023-11-26: qty 90, 90d supply, fill #0
  Filled 2024-02-09: qty 90, 90d supply, fill #1
  Filled 2024-06-05: qty 90, 90d supply, fill #2
  Filled 2024-09-04: qty 90, 90d supply, fill #3

## 2023-11-26 MED ORDER — LOSARTAN POTASSIUM 25 MG PO TABS
25.0000 mg | ORAL_TABLET | Freq: Every day | ORAL | 1 refills | Status: DC
  Filled 2023-11-26: qty 90, 90d supply, fill #0
  Filled 2024-02-09: qty 90, 90d supply, fill #1

## 2023-12-31 ENCOUNTER — Other Ambulatory Visit (HOSPITAL_COMMUNITY): Payer: Self-pay

## 2023-12-31 ENCOUNTER — Other Ambulatory Visit: Payer: Self-pay | Admitting: Podiatry

## 2024-01-03 ENCOUNTER — Other Ambulatory Visit (HOSPITAL_COMMUNITY): Payer: Self-pay

## 2024-01-03 MED ORDER — CICLOPIROX 8 % EX SOLN
CUTANEOUS | 0 refills | Status: AC
  Filled 2024-01-03: qty 6.6, 21d supply, fill #0

## 2024-01-31 ENCOUNTER — Other Ambulatory Visit (HOSPITAL_COMMUNITY): Payer: Self-pay

## 2024-01-31 MED ORDER — LEVOTHYROXINE SODIUM 137 MCG PO TABS
137.0000 ug | ORAL_TABLET | Freq: Every day | ORAL | 4 refills | Status: AC
  Filled 2024-01-31 – 2024-06-05 (×2): qty 90, 90d supply, fill #0
  Filled 2024-09-04: qty 90, 90d supply, fill #1

## 2024-02-04 ENCOUNTER — Encounter (INDEPENDENT_AMBULATORY_CARE_PROVIDER_SITE_OTHER): Payer: Self-pay | Admitting: Otolaryngology

## 2024-02-08 ENCOUNTER — Other Ambulatory Visit: Payer: Self-pay | Admitting: Family Medicine

## 2024-02-08 DIAGNOSIS — Z1231 Encounter for screening mammogram for malignant neoplasm of breast: Secondary | ICD-10-CM

## 2024-02-09 ENCOUNTER — Other Ambulatory Visit (HOSPITAL_COMMUNITY): Payer: Self-pay

## 2024-02-09 ENCOUNTER — Ambulatory Visit
Admission: RE | Admit: 2024-02-09 | Discharge: 2024-02-09 | Disposition: A | Discharge status: Home / Self Care | Source: Ambulatory Visit | Attending: Family Medicine | Admitting: Family Medicine

## 2024-02-09 DIAGNOSIS — Z1231 Encounter for screening mammogram for malignant neoplasm of breast: Secondary | ICD-10-CM

## 2024-02-10 ENCOUNTER — Encounter: Payer: Self-pay | Admitting: Podiatry

## 2024-02-10 ENCOUNTER — Ambulatory Visit: Admitting: Podiatry

## 2024-02-10 VITALS — Ht 65.0 in | Wt 170.0 lb

## 2024-02-10 DIAGNOSIS — L600 Ingrowing nail: Secondary | ICD-10-CM | POA: Diagnosis not present

## 2024-02-10 NOTE — Patient Instructions (Signed)

## 2024-02-14 NOTE — Progress Notes (Signed)
 Subjective:   Patient ID: Kathryn Sexton, female   DOB: 53 y.o.   MRN: [id number]   HPI Patient presents stating she has had a painful ingrown of the right big toe that make shoe gear difficult.  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  I infiltrated the right big toe 60 mg Xylocaine  Marcaine  mixture sterile prep done using sterile instrumentation remove the lateral border exposed matrix applied phenol 3 applications 30 seconds followed by alcohol lavage sterile dressing gave instructions on soaks wear dressing 24 hours take it off earlier if throbbing were to occur and encouraged to call with questions concerns which may arise during recovery

## 2024-04-21 ENCOUNTER — Encounter (INDEPENDENT_AMBULATORY_CARE_PROVIDER_SITE_OTHER): Payer: Self-pay

## 2024-04-25 ENCOUNTER — Ambulatory Visit (INDEPENDENT_AMBULATORY_CARE_PROVIDER_SITE_OTHER): Admitting: Otolaryngology

## 2024-04-25 ENCOUNTER — Other Ambulatory Visit (HOSPITAL_BASED_OUTPATIENT_CLINIC_OR_DEPARTMENT_OTHER): Payer: Self-pay

## 2024-04-25 ENCOUNTER — Encounter (INDEPENDENT_AMBULATORY_CARE_PROVIDER_SITE_OTHER): Payer: Self-pay | Admitting: Otolaryngology

## 2024-04-25 ENCOUNTER — Other Ambulatory Visit (HOSPITAL_COMMUNITY): Payer: Self-pay

## 2024-04-25 VITALS — BP 136/85 | HR 96

## 2024-04-25 DIAGNOSIS — R09A2 Foreign body sensation, throat: Secondary | ICD-10-CM

## 2024-04-25 DIAGNOSIS — C73 Malignant neoplasm of thyroid gland: Secondary | ICD-10-CM

## 2024-04-25 DIAGNOSIS — K219 Gastro-esophageal reflux disease without esophagitis: Secondary | ICD-10-CM | POA: Diagnosis not present

## 2024-04-25 DIAGNOSIS — Z9889 Other specified postprocedural states: Secondary | ICD-10-CM

## 2024-04-25 MED ORDER — FAMOTIDINE 20 MG PO TABS
20.0000 mg | ORAL_TABLET | Freq: Two times a day (BID) | ORAL | 1 refills | Status: AC
  Filled 2024-04-25: qty 30, 15d supply, fill #0

## 2024-04-25 NOTE — Progress Notes (Signed)
 ENT CONSULT:  Reason for Consult: globus sensation   HPI: Discussed the use of AI scribe software for clinical note transcription with the patient, who gave verbal consent to proceed.  History of Present Illness Kathryn Sexton is a 53 year old female with history of total thyroidectomy for PTC, no RAI post-op, who presents with a sensation of something stuck in her throat.  She experiences a sensation of something being stuck in her throat, particularly while swallowing. This sensation occurs intermittently. The issue began after her total thyroidectomy.  She has a history of heartburn or reflux and has previously taken pantoprazole for two weeks. She occasionally uses Tums when needed. No regular trouble with swallowing, voice changes or pain with swallowing.    Records Reviewed:  Office visit with Dr Eletha Gen Surg 10/06/22 Papillary thyroid  carcinoma (CMS-HCC)  Status post total thyroidectomy  Patient has done well following total thyroidectomy. It appears she will not require radioactive iodine treatment. Her dosage of levothyroxine  has been increased by her endocrinologist. She will have additional laboratory studies in the near future.     Past Medical History:  Diagnosis Date   Diabetes mellitus without complication (HCC)    Type II   GERD (gastroesophageal reflux disease)    HLD (hyperlipidemia)    Hypertension    states BP changes, no medication   Uterine fibroid     Past Surgical History:  Procedure Laterality Date   BREAST BIOPSY Right 09/02/2018   Fibroadenoma   BREAST BIOPSY Right 09/02/2018   CSL   CESAREAN SECTION     ROBOT ASSISTED MYOMECTOMY N/A 01/09/2013   Procedure: ROBOTIC ASSISTED MYOMECTOMY;  Surgeon: Percilla Burly, MD;  Location: [facility];  Service: Gynecology;  Laterality: N/A;   ROBOTIC ASSISTED LAPAROSCOPIC HYSTERECTOMY AND SALPINGECTOMY Bilateral 04/23/2020   Procedure: XI ROBOTIC ASSISTED TOTAL LAPAROSCOPIC HYSTERECTOMY AND SALPINGECTOMY;   Surgeon: Lavoie, Marie-Lyne, MD;  Location: [facility];  Service: Gynecology;  Laterality: Bilateral;   ROBOTIC ASSISTED LAPAROSCOPIC LYSIS OF ADHESION  04/23/2020   Procedure: XI ROBOTIC ASSISTED LAPAROSCOPIC EXTENSIVE LYSIS OF ADHESION;  Surgeon: Lavoie, Marie-Lyne, MD;  Location: [facility] [place];  Service: Gynecology;;   THYROIDECTOMY N/A 07/09/2022   Procedure: TOTAL THYROIDECTOMY WITH LIMITED LYMPH NODE DISSECTION;  Surgeon: Eletha Boas, MD;  Location: [facility];  Service: General;  Laterality: N/A;    Family History  Problem Relation Age of Onset   Hypertension Mother    Diabetes Mother    Thyroid  nodules Sister    Hyperlipidemia Brother    Hypertension Brother    Diabetes Brother    Hypotension Maternal Grandmother    Hypertension Paternal Grandmother    Diabetes Maternal Uncle    Kidney disease Maternal Uncle    Stroke Neg Hx    Cancer Neg Hx    Heart disease Neg Hx     Social History:  reports that she has never smoked. She has never used smokeless tobacco. She reports that she does not drink alcohol and does not use drugs.  Allergies:  Allergies  Allergen Reactions   Metformin Itching   Other     Surgical glue: Rash Itching    Medications: I have reviewed the patient's current medications.  The PMH, PSH, Medications, Allergies, and SH were reviewed and updated.  ROS: Constitutional: Negative for fever, weight loss and weight gain. Cardiovascular: Negative for chest pain and dyspnea on exertion. Respiratory: Is not experiencing shortness of breath at rest. Gastrointestinal: Negative for nausea and vomiting. Neurological: Negative  for headaches. Psychiatric: The patient is not nervous/anxious  Last menstrual period 03/28/2020. There is no height or weight on file to calculate BMI.  PHYSICAL EXAM:  Exam: General: Well-developed, well-nourished Communication and Voice: Clear pitch and clarity Respiratory Respiratory effort: Equal  inspiration and expiration without stridor Cardiovascular Peripheral Vascular: Warm extremities with equal color/perfusion Eyes: No nystagmus with equal extraocular motion bilaterally Neuro/Psych/Balance: Patient oriented to person, place, and time; Appropriate mood and affect; Gait is intact with no imbalance; Cranial nerves I-XII are intact Head and Face Inspection: Normocephalic and atraumatic without mass or lesion Palpation: Facial skeleton intact without bony stepoffs Salivary Glands: No mass or tenderness Facial Strength: Facial motility symmetric and full bilaterally ENT Pinna: External ear intact and fully developed External canal: Canal is patent with intact skin Tympanic Membrane: Clear and mobile External Nose: No scar or anatomic deformity Internal Nose: Septum is relatively straight. No polyp, or purulence. Mucosal edema and erythema present.  Bilateral inferior turbinate hypertrophy.  Lips, Teeth, and gums: Mucosa and teeth intact and viable TMJ: No pain to palpation with full mobility Oral cavity/oropharynx: No erythema or exudate, no lesions present Nasopharynx: No mass or lesion with intact mucosa Hypopharynx: Intact mucosa without pooling of secretions Larynx Glottic: Full true vocal cord mobility without lesion or mass Supraglottic: Normal appearing epiglottis and AE folds Interarytenoid Space: Moderate pachydermia&edema Subglottic Space: Patent without lesion or edema Neck Neck and Trachea: Midline trachea without mass or lesion Thyroid : No mass or nodularity Lymphatics: No lymphadenopathy  Procedure: Preoperative diagnosis: globus    Postoperative diagnosis:   Same + GERD LPR  Procedure: Flexible fiberoptic laryngoscopy  Surgeon: Elena Larry, MD  Anesthesia: Topical lidocaine  and Afrin Complications: None Condition is stable throughout exam  Indications and consent:  The patient presents to the clinic with above symptoms. Indirect laryngoscopy  view was incomplete. Thus it was recommended that they undergo a flexible fiberoptic laryngoscopy. All of the risks, benefits, and potential complications were reviewed with the patient preoperatively and verbal informed consent was obtained.  Procedure: The patient was seated upright in the clinic. Topical lidocaine  and Afrin were applied to the nasal cavity. After adequate anesthesia had occurred, I then proceeded to pass the flexible telescope into the nasal cavity. The nasal cavity was patent without rhinorrhea or polyp. The nasopharynx was also patent without mass or lesion. The base of tongue was visualized and was normal. There were no signs of pooling of secretions in the piriform sinuses. The true vocal folds were mobile bilaterally. There were no signs of glottic or supraglottic mucosal lesion or mass. There was moderate interarytenoid pachydermia and post cricoid edema. The telescope was then slowly withdrawn and the patient tolerated the procedure throughout.   Studies Reviewed: CT neck soft tissue 06/05/22 CLINICAL DATA:  Papillary thyroid  carcinoma, evaluate for lymphadenopathy   EXAM: CT NECK WITH CONTRAST   TECHNIQUE: Multidetector CT imaging of the neck was performed using the standard protocol following the bolus administration of intravenous contrast.   RADIATION DOSE REDUCTION: This exam was performed according to the departmental dose-optimization program which includes automated exposure control, adjustment of the mA and/or kV according to patient size and/or use of iterative reconstruction technique.   CONTRAST:  75mL ISOVUE -300 IOPAMIDOL  (ISOVUE -300) INJECTION 61%   COMPARISON:  None Available.   FINDINGS: Pharynx and larynx: Normal. No mass or swelling.   Salivary glands: No inflammation, mass, or stone.   Thyroid : Thyroid  nodules recently evaluated by ultrasound and biopsy.   Lymph nodes: 7  mm rounded submental lymph node which enhances similar to adjacent  nodes. Otherwise unremarkable nodes of the neck.   Vascular: Negative   Limited intracranial: Negative   Visualized orbits: Negative   Mastoids and visualized paranasal sinuses: Clear   Skeleton: No acute finding. Ordinary degenerative changes in the cervical spine.   Upper chest: No apical pulmonary nodules.   IMPRESSION: History of papillary thyroid  cancer. A submental lymph node is rounded and prominent compared to adjacent nodes but of lesser concern given only 7 mm diameter. No worrisome nodes in the lower neck.  Thyroid  U/S 09/07/22 IMPRESSION: Post total thyroidectomy without evidence of residual or locally recurrent disease.  Assessment/Plan: No diagnosis found.  Assessment and Plan Assessment & Plan Globus sensation likely secondary to laryngopharyngeal reflux Intermittent chronic globus sensation likely due to chronic reflux irritation Reflux is primary suspect, since she is symptomatic at times, and scope exam was clear aside from changes in post-cricoid area c/w GERD LPR - Pepcid  20 mg BID  -  Reflux Gourmet after meals - diet and lifestyle changes to minimize GERD - Refer to [employer] blog for dietary and lifestyle modifications/reflux cook book  GERD LPR Likely the main reason for globus sensation  - Pepcid  20 mg BID  -  Reflux Gourmet after meals - diet and lifestyle changes to minimize GERD - Refer to [employer] for dietary and lifestyle modifications/reflux cook book   Thank you for allowing me to participate in the care of this patient. Please do not hesitate to contact me with any questions or concerns.   Elena Larry, MD Otolaryngology [facility] Phone: [phone] Fax: [phone]    04/25/2024, 1:42 PM

## 2024-04-25 NOTE — Patient Instructions (Signed)

## 2024-06-05 ENCOUNTER — Other Ambulatory Visit (HOSPITAL_COMMUNITY): Payer: Self-pay

## 2024-06-06 ENCOUNTER — Other Ambulatory Visit: Payer: Self-pay

## 2024-06-06 ENCOUNTER — Other Ambulatory Visit (HOSPITAL_COMMUNITY): Payer: Self-pay

## 2024-06-06 MED ORDER — LOSARTAN POTASSIUM 25 MG PO TABS
25.0000 mg | ORAL_TABLET | Freq: Every day | ORAL | 1 refills | Status: DC
  Filled 2024-06-06: qty 90, 90d supply, fill #0
  Filled 2024-09-04: qty 90, 90d supply, fill #1

## 2024-06-07 ENCOUNTER — Other Ambulatory Visit (HOSPITAL_COMMUNITY): Payer: Self-pay

## 2024-06-08 ENCOUNTER — Other Ambulatory Visit (HOSPITAL_COMMUNITY): Payer: Self-pay

## 2024-06-09 ENCOUNTER — Other Ambulatory Visit (HOSPITAL_COMMUNITY): Payer: Self-pay

## 2024-06-09 MED ORDER — OZEMPIC (0.25 OR 0.5 MG/DOSE) 2 MG/3ML ~~LOC~~ SOPN
0.5000 mg | PEN_INJECTOR | SUBCUTANEOUS | 5 refills | Status: AC
  Filled 2024-06-09: qty 3, 28d supply, fill #0

## 2024-06-15 ENCOUNTER — Other Ambulatory Visit (HOSPITAL_COMMUNITY): Payer: Self-pay

## 2024-06-16 ENCOUNTER — Other Ambulatory Visit (HOSPITAL_COMMUNITY): Payer: Self-pay

## 2024-06-19 ENCOUNTER — Other Ambulatory Visit (HOSPITAL_COMMUNITY): Payer: Self-pay

## 2024-06-21 ENCOUNTER — Other Ambulatory Visit (HOSPITAL_COMMUNITY): Payer: Self-pay

## 2024-06-22 ENCOUNTER — Other Ambulatory Visit (HOSPITAL_COMMUNITY): Payer: Self-pay

## 2024-06-28 ENCOUNTER — Other Ambulatory Visit (HOSPITAL_COMMUNITY): Payer: Self-pay

## 2024-06-29 ENCOUNTER — Other Ambulatory Visit (HOSPITAL_COMMUNITY): Payer: Self-pay

## 2024-06-29 MED ORDER — OZEMPIC (0.25 OR 0.5 MG/DOSE) 2 MG/3ML ~~LOC~~ SOPN
0.5000 | PEN_INJECTOR | SUBCUTANEOUS | 5 refills | Status: AC
  Filled 2024-06-29: qty 3, 28d supply, fill #0
  Filled 2024-08-05: qty 9, 84d supply, fill #0

## 2024-07-04 ENCOUNTER — Other Ambulatory Visit (HOSPITAL_COMMUNITY): Payer: Self-pay

## 2024-07-06 ENCOUNTER — Other Ambulatory Visit (HOSPITAL_COMMUNITY): Payer: Self-pay

## 2024-07-11 ENCOUNTER — Other Ambulatory Visit (HOSPITAL_COMMUNITY): Payer: Self-pay

## 2024-07-18 ENCOUNTER — Other Ambulatory Visit (HOSPITAL_COMMUNITY): Payer: Self-pay

## 2024-07-27 ENCOUNTER — Other Ambulatory Visit (HOSPITAL_COMMUNITY): Payer: Self-pay

## 2024-08-05 ENCOUNTER — Other Ambulatory Visit (HOSPITAL_COMMUNITY): Payer: Self-pay

## 2024-08-07 ENCOUNTER — Other Ambulatory Visit (HOSPITAL_COMMUNITY): Payer: Self-pay

## 2024-09-04 ENCOUNTER — Other Ambulatory Visit: Payer: Self-pay

## 2024-09-04 ENCOUNTER — Other Ambulatory Visit (HOSPITAL_COMMUNITY): Payer: Self-pay

## 2024-09-04 MED ORDER — LOSARTAN POTASSIUM 25 MG PO TABS
25.0000 mg | ORAL_TABLET | Freq: Every day | ORAL | 1 refills | Status: AC
  Filled 2024-09-04 (×2): qty 90, 90d supply, fill #0

## 2024-09-04 MED ORDER — ATORVASTATIN CALCIUM 20 MG PO TABS
20.0000 mg | ORAL_TABLET | Freq: Every day | ORAL | 3 refills | Status: AC
  Filled 2024-09-04: qty 90, 90d supply, fill #0

## 2024-09-04 MED ORDER — JARDIANCE 25 MG PO TABS
25.0000 mg | ORAL_TABLET | Freq: Every day | ORAL | 3 refills | Status: AC
  Filled 2024-09-04 (×2): qty 90, 90d supply, fill #0

## 2024-09-05 ENCOUNTER — Other Ambulatory Visit (HOSPITAL_COMMUNITY): Payer: Self-pay

## 2024-09-06 ENCOUNTER — Other Ambulatory Visit (HOSPITAL_COMMUNITY): Payer: Self-pay

## 2024-09-07 ENCOUNTER — Other Ambulatory Visit (HOSPITAL_COMMUNITY): Payer: Self-pay

## 2024-09-07 MED ORDER — CEQUA 0.09 % OP SOLN
1.0000 [drp] | Freq: Two times a day (BID) | OPHTHALMIC | 11 refills | Status: AC
  Filled 2024-09-07: qty 60, 30d supply, fill #0

## 2024-09-07 MED ORDER — CEQUA 0.09 % OP SOLN
1.0000 [drp] | Freq: Two times a day (BID) | OPHTHALMIC | 4 refills | Status: AC
  Filled 2024-09-07: qty 180, 90d supply, fill #0

## 2024-09-08 ENCOUNTER — Other Ambulatory Visit: Payer: Self-pay

## 2024-09-08 ENCOUNTER — Other Ambulatory Visit (HOSPITAL_COMMUNITY): Payer: Self-pay

## 2024-09-11 ENCOUNTER — Other Ambulatory Visit (HOSPITAL_COMMUNITY): Payer: Self-pay

## 2024-09-11 MED ORDER — TYRVAYA 0.03 MG/ACT NA SOLN
1.0000 | Freq: Two times a day (BID) | NASAL | 11 refills | Status: AC
  Filled 2024-09-11: qty 8.4, 30d supply, fill #0

## 2024-09-12 ENCOUNTER — Other Ambulatory Visit (HOSPITAL_COMMUNITY): Payer: Self-pay
